# Patient Record
Sex: Male | Born: 1965 | Race: White | Hispanic: No | Marital: Married | Smoking: Former smoker
Health system: Southern US, Community
[De-identification: ages and names within clinical notes are randomized; demographics above are authoritative.]

## PROBLEM LIST (undated history)

## (undated) DIAGNOSIS — M549 Dorsalgia, unspecified: Secondary | ICD-10-CM

## (undated) DIAGNOSIS — F419 Anxiety disorder, unspecified: Secondary | ICD-10-CM

## (undated) DIAGNOSIS — R631 Polydipsia: Secondary | ICD-10-CM

## (undated) DIAGNOSIS — F32A Depression, unspecified: Secondary | ICD-10-CM

## (undated) DIAGNOSIS — G479 Sleep disorder, unspecified: Secondary | ICD-10-CM

## (undated) DIAGNOSIS — K219 Gastro-esophageal reflux disease without esophagitis: Secondary | ICD-10-CM

## (undated) DIAGNOSIS — R42 Dizziness and giddiness: Secondary | ICD-10-CM

## (undated) DIAGNOSIS — I10 Essential (primary) hypertension: Secondary | ICD-10-CM

## (undated) DIAGNOSIS — F329 Major depressive disorder, single episode, unspecified: Secondary | ICD-10-CM

## (undated) DIAGNOSIS — J019 Acute sinusitis, unspecified: Secondary | ICD-10-CM

## (undated) DIAGNOSIS — I2692 Saddle embolus of pulmonary artery without acute cor pulmonale: Secondary | ICD-10-CM

## (undated) DIAGNOSIS — K649 Unspecified hemorrhoids: Secondary | ICD-10-CM

## (undated) DIAGNOSIS — E785 Hyperlipidemia, unspecified: Secondary | ICD-10-CM

## (undated) DIAGNOSIS — D649 Anemia, unspecified: Secondary | ICD-10-CM

## (undated) HISTORY — PX: SHOULDER ARTHROSCOPY: SHX128

## (undated) HISTORY — PX: ANTERIOR CERVICAL DECOMP/DISCECTOMY FUSION: SHX1161

## (undated) HISTORY — DX: Essential (primary) hypertension: I10

## (undated) HISTORY — PX: KNEE ARTHROSCOPY: SHX127

## (undated) HISTORY — PX: ELBOW ARTHROSCOPY: SUR87

## (undated) HISTORY — DX: Hyperlipidemia, unspecified: E78.5

## (undated) HISTORY — PX: VASECTOMY: SHX75

---

## 2001-11-28 ENCOUNTER — Encounter: Admission: RE | Admit: 2001-11-28 | Discharge: 2001-11-29 | Payer: Self-pay | Admitting: *Deleted

## 2004-07-01 ENCOUNTER — Observation Stay (HOSPITAL_COMMUNITY): Admission: RE | Admit: 2004-07-01 | Discharge: 2004-07-02 | Payer: Self-pay | Admitting: Neurosurgery

## 2006-05-15 ENCOUNTER — Ambulatory Visit: Payer: Self-pay | Admitting: Cardiovascular Disease

## 2006-05-15 ENCOUNTER — Observation Stay (HOSPITAL_COMMUNITY): Admission: AD | Admit: 2006-05-15 | Discharge: 2006-05-16 | Payer: Self-pay | Admitting: Cardiovascular Disease

## 2006-09-03 IMAGING — CR DG CHEST 2V
2 series · 2 of 2 positions shown · non-contrast
Comparison: None.

CLINICAL DATA: Disc herniation. Preoperative respiratory evaluation. Smoker with
cough.

CHEST - 2 VIEW  06/29/2004:

[view not recorded (1 of 2)]
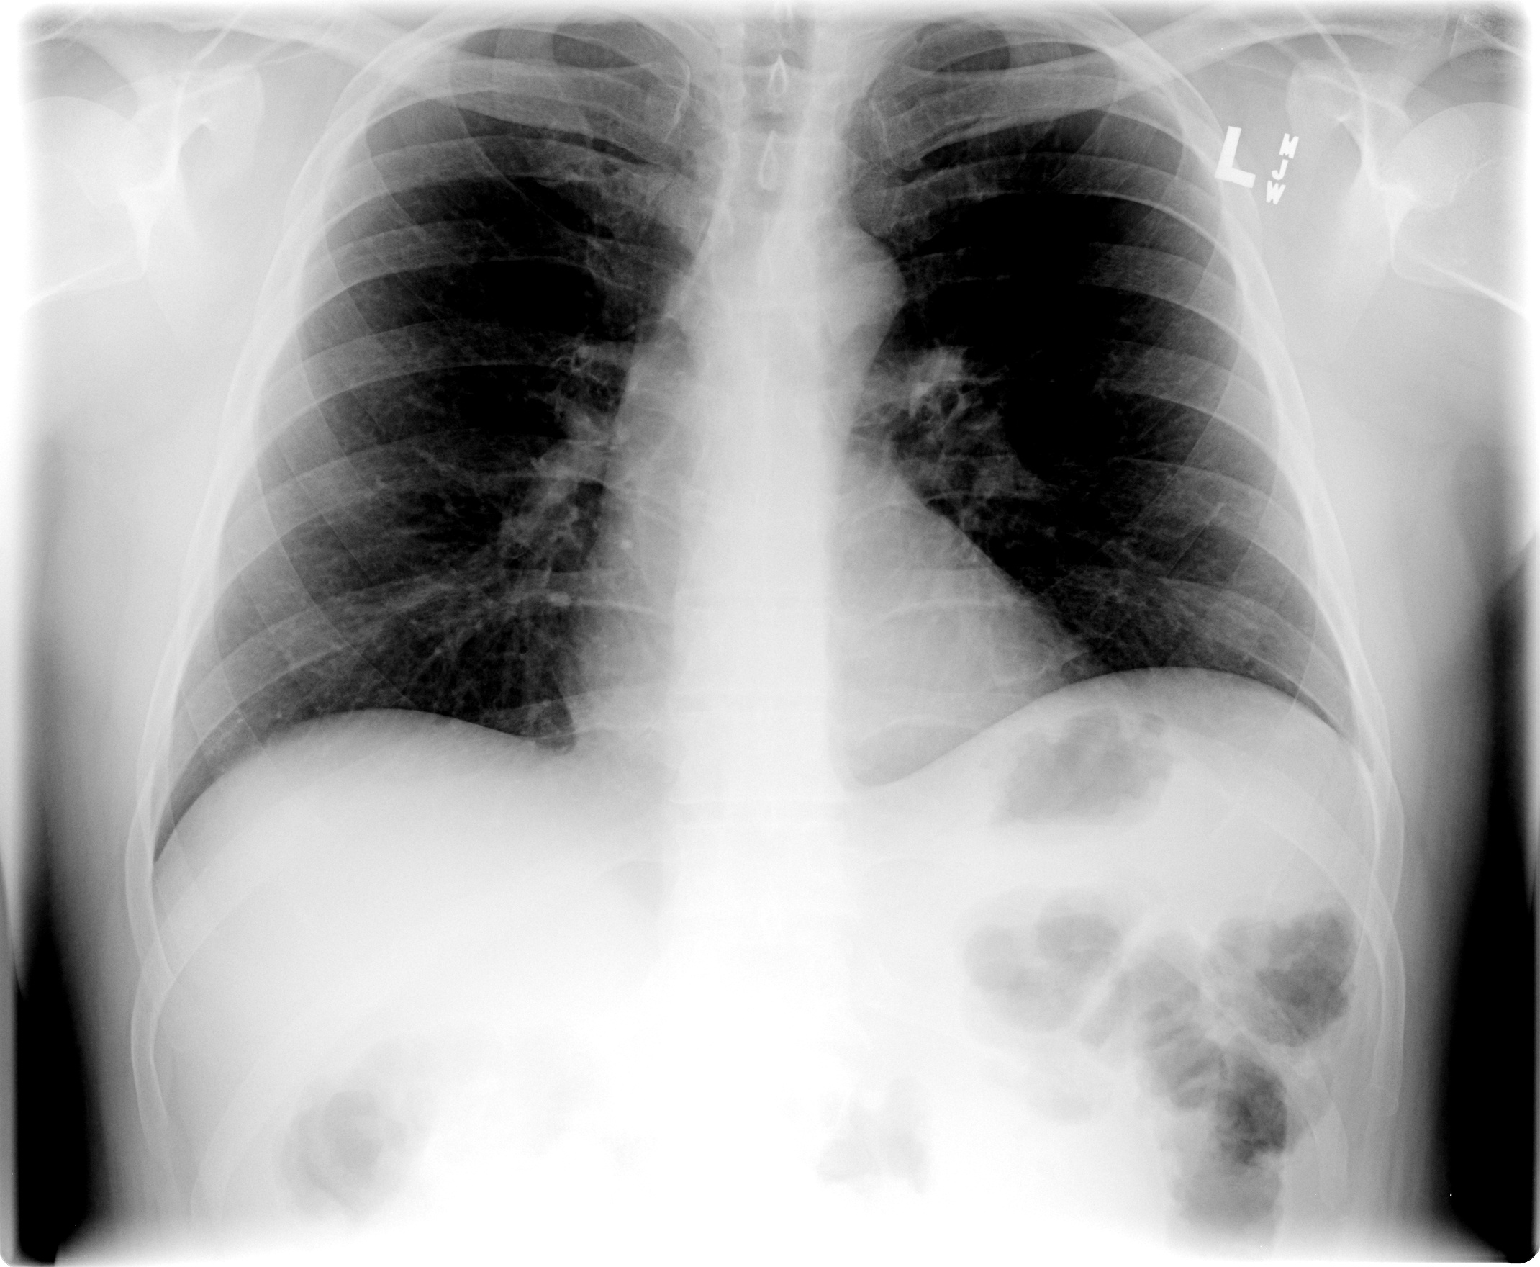

[view not recorded (2 of 2)]
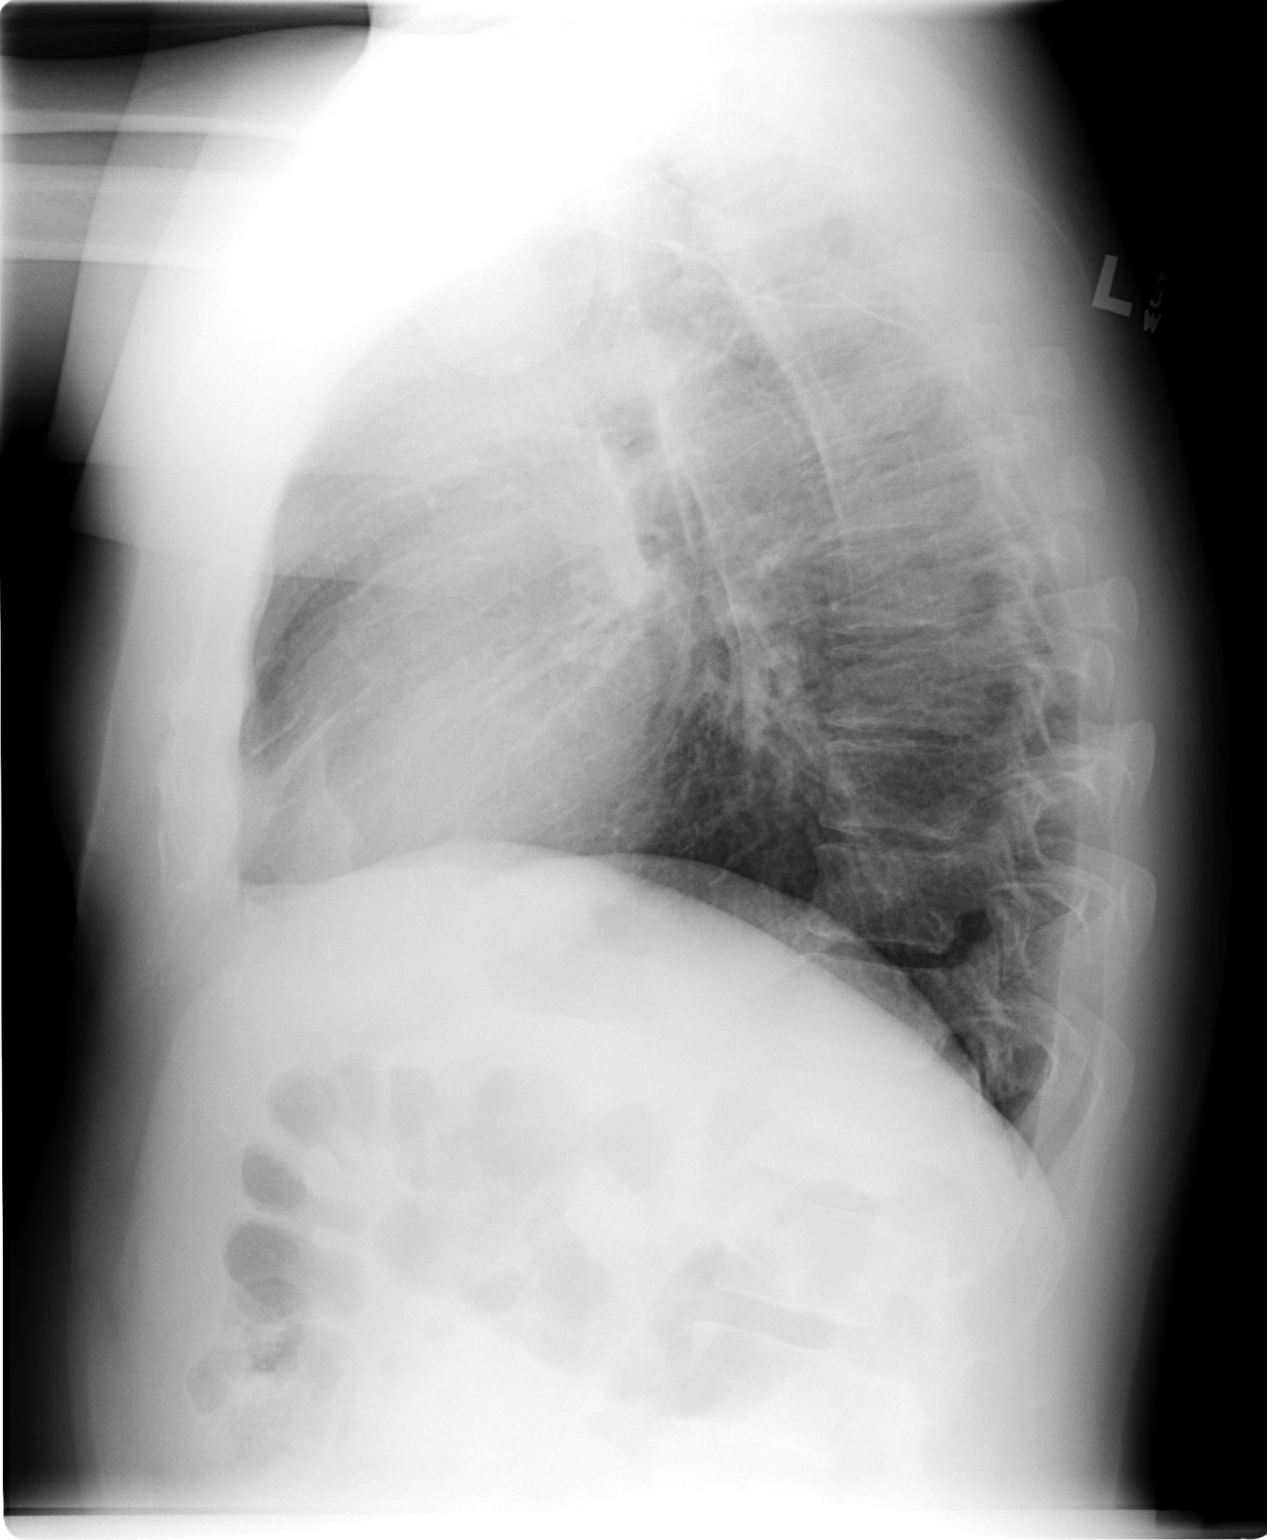

[2 of 2 positions shown; findings below may reference images not displayed]

FINDINGS: The heart size is normal. The thoracic aorta is minimally tortuous.
The hilar and mediastinal contours are otherwise unremarkable. Prominent
bronchovascular markings diffusely with mild central peribronchial thickening is
consistent with chronic bronchitis. The lungs appear clear otherwise. There are
no pleural effusions. Minimal early degenerative changes are present in the
thoracic spine.
IMPRESSION: Probable COPD. No evidence of acute disease.

## 2008-07-19 IMAGING — CR DG CHEST 2V
2 series · 2 of 2 positions shown · non-contrast
Comparison: 06/29/04.

CLINICAL DATA: 40-year-old male with chest pain, unstable angina, precardiac catheterization.  
 CHEST - 2 VIEW:

[view not recorded (1 of 2)]
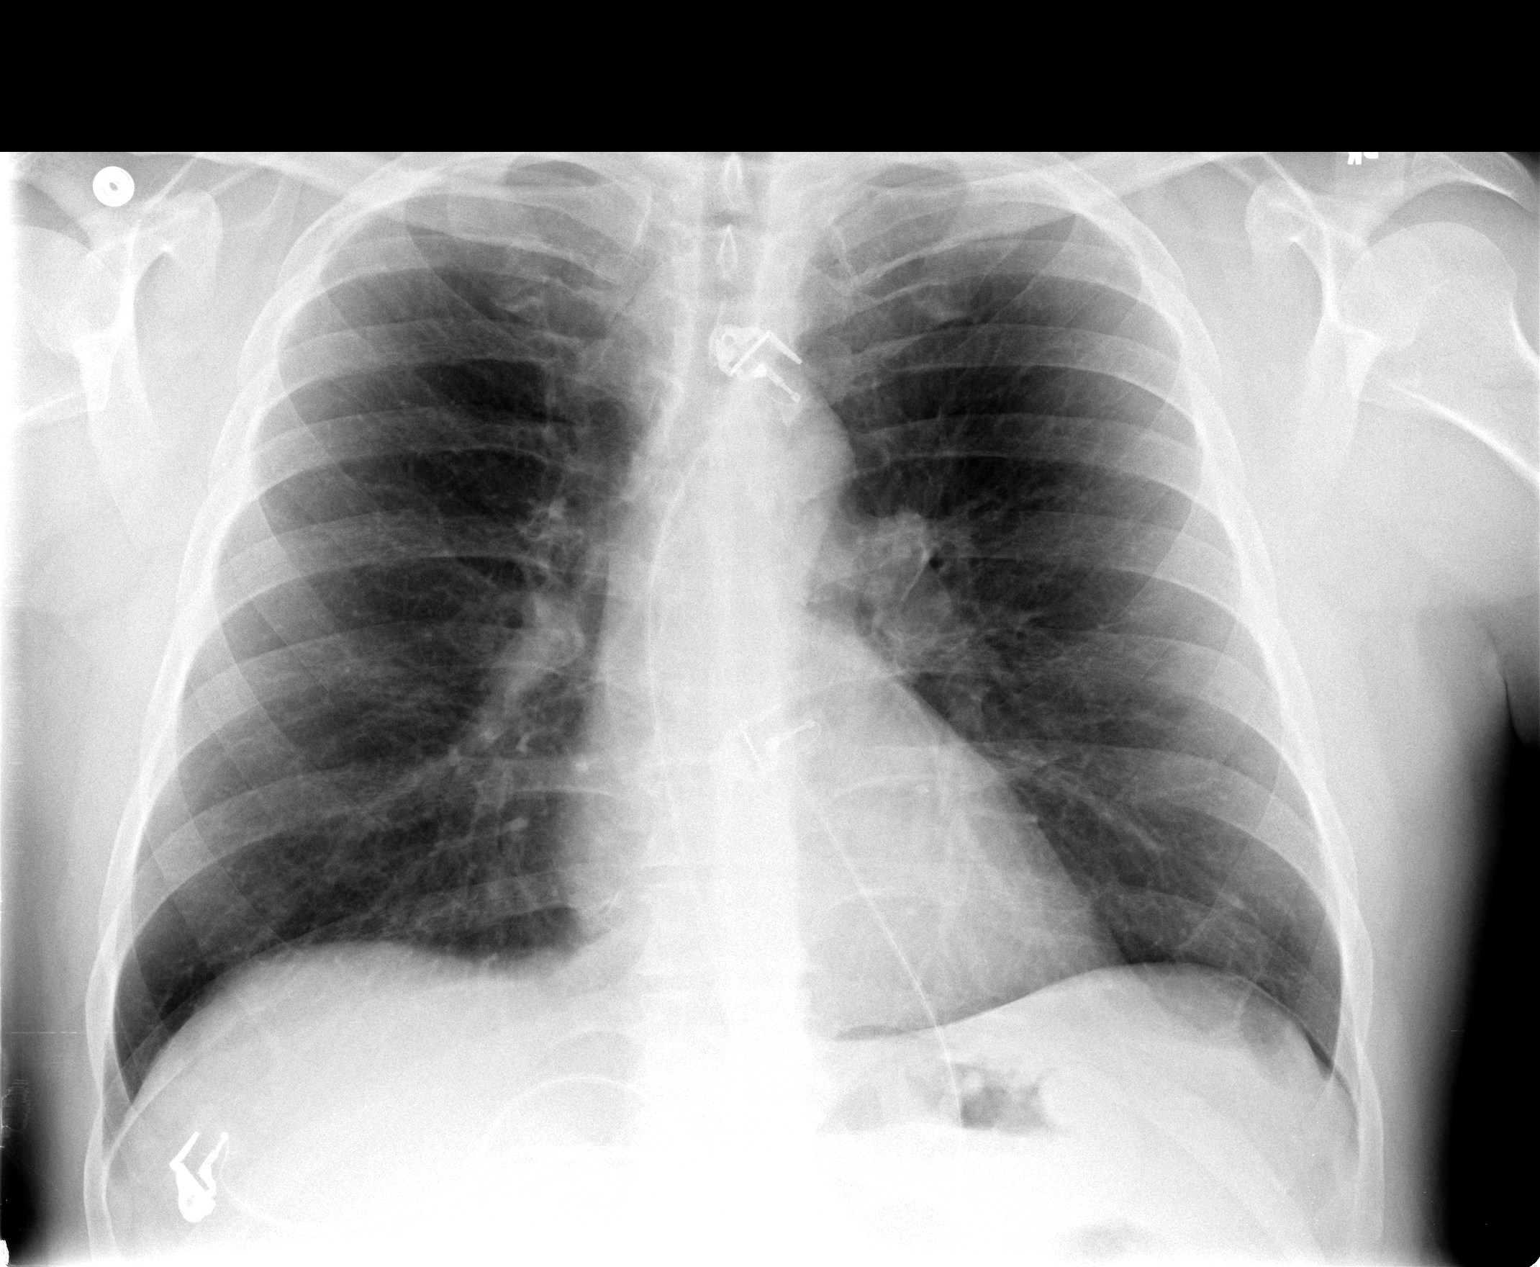

[view not recorded (2 of 2)]
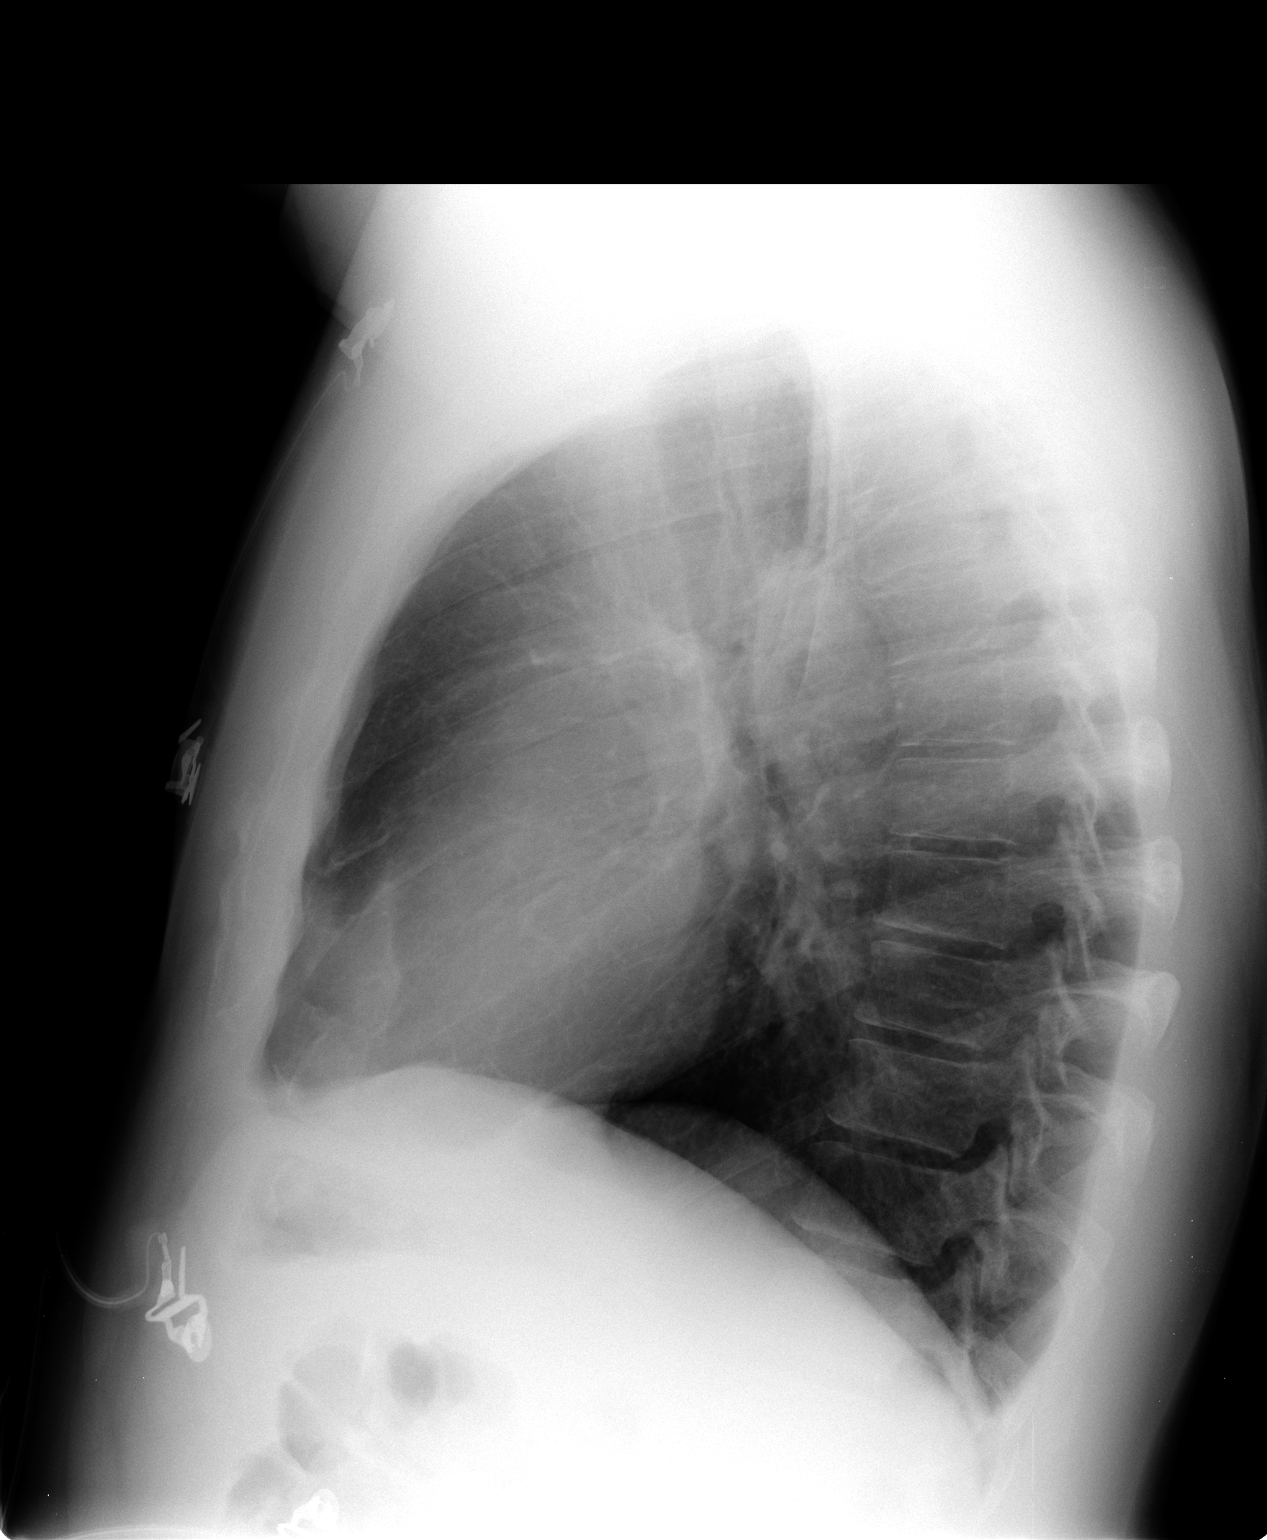

[2 of 2 positions shown; findings below may reference images not displayed]

FINDINGS: The heart size and mediastinal contours are within normal limits.  Both lungs are clear.  The visualized skeletal structures are unremarkable.
IMPRESSION: No active cardiopulmonary disease.

## 2013-02-18 ENCOUNTER — Encounter (INDEPENDENT_AMBULATORY_CARE_PROVIDER_SITE_OTHER): Payer: Self-pay | Admitting: Surgery

## 2013-02-18 ENCOUNTER — Ambulatory Visit (INDEPENDENT_AMBULATORY_CARE_PROVIDER_SITE_OTHER): Payer: No Typology Code available for payment source | Admitting: Surgery

## 2013-02-18 ENCOUNTER — Telehealth (INDEPENDENT_AMBULATORY_CARE_PROVIDER_SITE_OTHER): Payer: Self-pay | Admitting: Surgery

## 2013-02-18 VITALS — BP 130/82 | HR 76 | Resp 14 | Ht 70.0 in | Wt 175.2 lb

## 2013-02-18 DIAGNOSIS — K644 Residual hemorrhoidal skin tags: Secondary | ICD-10-CM | POA: Insufficient documentation

## 2013-02-18 DIAGNOSIS — K648 Other hemorrhoids: Secondary | ICD-10-CM | POA: Insufficient documentation

## 2013-02-18 NOTE — Patient Instructions (Addendum)
See the Handout(s) we gave you.  Consider surgery.  Please call our office at [phone] if you wish to schedule surgery or if you have further questions / concerns.    HEMORRHOIDS  The rectum is the last foot of your colon, and it naturally stretches to hold stool.  Hemorrhoidal piles are natural clusters of blood vessels that help the rectum and anal canal stretch to hold stool and allow bowel movements to eliminate feces.   Hemorrhoids are abnormally swollen blood vessels in the rectum.  Too much pressure in the rectum causes hemorrhoids by forcing blood to stretch and bulge the walls of the veins, sometimes even rupturing them.  Hemorrhoids can become like varicose veins you might see on a person's legs.  Most people will develop a flare of hemorrhoids in their lifetime.  When bulging hemorrhoidal veins are irritated, they can swell, burn, itch, cause pain, and bleed.  Most flares will calm down gradually own within a few weeks.  However, once hemorrhoids are created, they are difficult to get rid of completely and tend to flare more easily than the first flare.   Fortunately, good habits and simple medical treatment usually control hemorrhoids well, and surgery is needed only in severe cases. Types of Hemorrhoids:  Internal hemorrhoids usually don't initially hurt or itch; they are deep inside the rectum and usually have no sensation. If they begin to push out (prolapse), pain and burning can occur.  However, internal hemorrhoids can bleed.  Anal bleeding should not be ignored since bleeding could come from a dangerous source like colorectal cancer, so persistent rectal bleeding should be investigated by a doctor, sometimes with a colonoscopy.  External hemorrhoids cause most of the symptoms - pain, burning, and itching. Nonirritated hemorrhoids can look like small skin tags coming out of the anus.   Thrombosed hemorrhoids can form when a hemorrhoid blood vessel bursts and causes the hemorrhoid  to suddenly swell.  A purple blood clot can form in it and become an excruciatingly painful lump at the anus. Because of these unpleasant symptoms, immediate incision and drainage by a surgeon at an office visit can provide much relief of the pain.    PREVENTION Avoiding the most frequent causes listed below will prevent most cases of hemorrhoids: Constipation Hard stools Diarrhea  Constant sitting  Straining with bowel movements Sitting on the toilet for a long time  Severe coughing  episodes Pregnancy / Childbirth  Heavy Lifting  Sometimes avoiding the above triggers is difficult:  How can you avoid sitting all day if you have a seated job? Also, we try to avoid coughing and diarrhea, but sometimes it's beyond your control.  Still, there are some practical hints to help: Keep the anal and genital area clean.  Moistened tissues such as flushable wet wipes are less irritating than toilet paper.  Using irrigating showers or bottle irrigation washing gently cleans this sensitive area.   Avoid dry toilet paper when cleaning after bowel movements.  Marland Kitchen Keep the anal and genital area dry.  Lightly pat the rectal area dry.  Avoid rubbing.  Talcum or baby powders can help GET YOUR STOOLS SOFT.   This is the most important way to prevent irritated hemorrhoids.  Hard stools are like sandpaper to the anorectal canal and will cause more problems.  The goal: ONE SOFT BOWEL MOVEMENT A DAY!  BMs from every other day to 3 times a day is a tolerable range Treat coughing, diarrhea and constipation early since  irritated hemorrhoids may soon follow.  If your main job activity is seated, always stand or walk during your breaks. Make it a point to stand and walk at least 5 minutes every hour and try to shift frequently in your chair to avoid direct rectal pressure.  Always exhale as you strain or lift. Don't hold your breath.  Do not delay or try to prevent a bowel movement when the urge is present. Exercise regularly  (walking or jogging 60 minutes a day) to stimulate the bowels to move. No reading or other activity while on the toilet. If bowel movements take longer than 5 minutes, you are too constipated. AVOID CONSTIPATION Drink plenty of liquids (1 1/2 to 2 quarts of water and other fluids a day unless fluid restricted for another medical condition). Liquids that contain caffeine (coffee a, tea, soft drinks) can be dehydrating and should be avoided until constipation is controlled. Consider minimizing milk, as dairy products may be constipating. Eat plenty of fiber (30g a day ideal, more if needed).  Fiber is the undigested part of plant food that passes into the colon, acting as "natures broom" to encourage bowel motility and movement.  Fiber can absorb and hold large amounts of water. This results in a larger, bulkier stool, which is soft and easier to pass.  Eating foods high in fiber - 12 servings - such as  Vegetables: Root (potatoes, carrots, turnips), Leafy green (lettuce, salad greens, celery, spinach), High residue (cabbage, broccoli, etc.) Fruit: Fresh, Dried (prunes, apricots, cherries), Stewed (applesauce)  Whole grain breads, pasta, whole wheat Bran cereals, muffins, etc. Consider adding supplemental bulking fiber which retains large volumes of water: Psyllium ground seeds --available as Metamucil, Konsyl, Effersyllium, Per Diem Fiber, or the less expensive generic forms.  Citrucel  (methylcellulose wood fiber) . FiberCon (Polycarbophil) Polyethylene Glycol - and "artificial" fiber commonly called Miralax or Glycolax.  It is helpful for people with gassy or bloated feelings with regular fiber Flax Seed - a less gassy natural fiber  Laxatives can be useful for a short period if constipation is severe Osmotics (Milk of Magnesia, Fleets Phospho-Soda, Magnesium Citrate)  Stimulants (Senokot,   Castor Oil,  Dulcolax, Ex-Lax)    Laxatives are not a good long-term solution as it can stress the bowels  and cause too much mineral loss and dehydration.   Avoid taking laxatives for more than 7 days in a row.  AVOID DIARRHEA Switch to liquids and simpler foods for a few days to avoid stressing your intestines further. Avoid dairy products (especially milk & ice cream) for a short time.  The intestines often can lose the ability to digest lactose when stressed. Avoid foods that cause gassiness or bloating.  Typical foods include beans and other legumes, cabbage, broccoli, and dairy foods.  Every person has some sensitivity to other foods, so listen to your body and avoid those foods that trigger problems for you. Adding fiber (Citrucel, Metamucil, FiberCon, Flax seed, Miralax) gradually can help thicken stools by absorbing excess fluid and retrain the intestines to act more normally.  Slowly increase the dose over a few weeks.  Too much fiber too soon can backfire and cause cramping & bloating. Probiotics (such as active yogurt, Align, etc) may help repopulate the intestines and colon with normal bacteria and calm down a sensitive digestive tract.  Most studies show it to be of mild help, though, and such products can be costly. Medicines: Bismuth subsalicylate (ex. Kayopectate, Pepto Bismol) every 30 minutes for up  to 6 doses can help control diarrhea.  Avoid if pregnant. Loperamide (Immodium) can slow down diarrhea.  Start with two tablets (4mg  total) first and then try one tablet every 6 hours.  Avoid if you are having fevers or severe pain.  If you are not better or start feeling worse, stop all medicines and call your doctor for advice Call your doctor if you are getting worse or not better.  Sometimes further testing (cultures, endoscopy, X-ray studies, bloodwork, etc) may be needed to help diagnose and treat the cause of the diarrhea. TREATMENT OF HEMORRHOID FLARE If these preventive measures fail, you must take action right away! Hemorrhoids are one condition that can be mild in the morning and  become intolerable by nightfall. Most hemorrhoidal flares take several weeks to calm down.  These suggestions can help: Warm soaks.  This helps more than any topical medication.  Use up to 8 times a day.  Usually sitz baths or sitting in a warm bathtub helps.  Sitting on moist warm towels are helpful.  Switching to ice packs/cool compresses can be helpful Normalize your bowels.  Extremes of diarrhea or constipation will make hemorrhoids worse.  One soft bowel movement a day is the goal.  Fiber can help get your bowels regular Wet wipes instead of toilet paper Pain control with a NSAID such as ibuprofen (Advil) or naproxen (Aleve) or acetaminophen (Tylenol) around the clock.  Narcotics are constipating and should be minimized if possible Topical creams contain steroids (bydrocortisone) or local anesthetic (xylocaine) can help make pain and itching more tolerable.   EVALUATION If hemorrhoids are still causing problems, you could benefit by an evaluation by a surgeon.  The surgeon will obtain a history and examine you.  If hemorrhoids are diagnosed, some therapies can be offered in the office, usually with an anoscope into the less sensitive area of the rectum: -injection of hemorrhoids (sclerotherapy) can scar the blood vessels of the swollen/enlarged hemorrhoids to help shrink them down to a more normal size -rubber banding of the enlarged hemorrhoids to help shrink them down to a more normal size -drainage of the blood clot causing a thrombosed hemorrhoid,  to relieve the severe pain   While 90% of the time such problems from hemorrhoids can be managed without preceding to surgery, sometimes the hemorrhoids require a operation to control the problem (uncontrolled bleeding, prolapse, pain, etc.).   This involves being placed under general anesthesia where the surgeon can confirm the diagnosis and remove, suture, or staple the hemorrhoid(s).  Your surgeon can help you treat the problem appropriately.

## 2013-02-18 NOTE — Progress Notes (Signed)
Subjective:     Patient ID: Guy Mayer, male   DOB: 1966-06-04, 47 y.o.   MRN: [id number]  HPI  Guy Mayer  1965-08-13 [id number]  Patient Care Team: Lavell Islam, MD as PCP - General (Internal Medicine) Theda Belfast, MD as Consulting Physician (Gastroenterology)  This patient is a 47 y.o.male who presents today for surgical evaluation at the request of self.   Reason for visit: Worsening/persistent hemorrhoids with prolapse bleeding and pain  Pleasant male that is struggled with hemorrhoids for many years.  Has tried bandings in the past.  Was felt to be a good candidate for PPH stapling in 2010.  Due to personal and work issues, he has delayed surgery.  However, now things are worsening and he wishes to reconsider.  He gets pain with bowel movements.  The hemorrhoids with prolapse out.  They are very painful when that happens..  Difficult to push back in.  Blood as frequent as well.  He usually has one bowel movement a morning.  He does not take a fiber supplement.  No severe dyskinesia.  He has had colonoscopy by Dr. Elnoria Howard in the past.  He has not had any treatments done since seen in 2010.  No personal nor family history of GI/colon cancer, inflammatory bowel disease, irritable bowel syndrome, allergy such as Celiac Sprue, dietary/dairy problems, colitis, ulcers nor gastritis.  No recent sick contacts/gastroenteritis.  No travel outside the country.  No changes in diet.    Patient Active Problem List   Diagnosis Date Noted  . Internal hemorrhoids with prolapse & bleeding 02/18/2013  . External hemorrhoids with pain 02/18/2013    Past Medical History  Diagnosis Date  . Hypertension   . Hyperlipidemia     Past Surgical History  Procedure Laterality Date  . Vasectomy    . Anterior cervical decomp/discectomy fusion    . Shoulder arthroscopy    . Elbow arthroscopy    . Knee arthroscopy      History   Social History  . Marital Status: Married    Spouse Name:  N/A    Number of Children: N/A  . Years of Education: N/A   Occupational History  . Not on file.   Social History Main Topics  . Smoking status: Current Every Day Smoker -- 0.25 packs/day  . Smokeless tobacco: Never Used  . Alcohol Use: 16.8 oz/week    28 Shots of liquor per week  . Drug Use: No  . Sexual Activity: Not on file   Other Topics Concern  . Not on file   Social History Narrative  . No narrative on file    History reviewed. No pertinent family history.  Current Outpatient Prescriptions  Medication Sig Dispense Refill  . dexlansoprazole (DEXILANT) 60 MG capsule Take 60 mg by mouth daily.       No current facility-administered medications for this visit.     No Known Allergies  BP 130/82  Pulse 76  Resp 14  Ht 5\' 10"  (1.778 m)  Wt 175 lb 3.2 oz (79.47 kg)  BMI 25.14 kg/m2  No results found.   Review of Systems  Constitutional: Negative for fever, chills and diaphoresis.  HENT: Negative for nosebleeds, sore throat, facial swelling, mouth sores, trouble swallowing and ear discharge.   Eyes: Negative for photophobia, discharge and visual disturbance.  Respiratory: Negative for choking, chest tightness, shortness of breath and stridor.   Cardiovascular: Negative for chest pain and palpitations.  Gastrointestinal: Positive for anal  bleeding and rectal pain. Negative for nausea, vomiting, abdominal pain, diarrhea, constipation, blood in stool and abdominal distention.  Endocrine: Negative for cold intolerance and heat intolerance.  Genitourinary: Negative for dysuria, urgency, difficulty urinating and testicular pain.  Musculoskeletal: Negative for myalgias, back pain, arthralgias and gait problem.  Skin: Negative for color change, pallor, rash and wound.  Allergic/Immunologic: Negative for environmental allergies and food allergies.  Neurological: Negative for dizziness, speech difficulty, weakness, numbness and headaches.  Hematological: Negative for  adenopathy. Does not bruise/bleed easily.  Psychiatric/Behavioral: Negative for hallucinations, confusion and agitation.       Objective:   Physical Exam  Constitutional: He is oriented to person, place, and time. He appears well-developed and well-nourished. No distress.  HENT:  Head: Normocephalic.  Mouth/Throat: Oropharynx is clear and moist. No oropharyngeal exudate.  Eyes: Conjunctivae and EOM are normal. Pupils are equal, round, and reactive to light. No scleral icterus.  Neck: Normal range of motion. Neck supple. No tracheal deviation present.  Cardiovascular: Normal rate, regular rhythm and intact distal pulses.   Pulmonary/Chest: Effort normal and breath sounds normal. No respiratory distress.  Abdominal: Soft. He exhibits no distension. There is no tenderness. Hernia confirmed negative in the right inguinal area and confirmed negative in the left inguinal area.  Genitourinary:  Exam done with assistance of male Medical Assistant in the room.  Perianal skin clean with good hygiene.  No pruritis.  No pilonidal disease.  No fissure.  No abscess/fistula.    Moderate R > L sided external skin tags / hemorrhoids.  Tolerates digital and anoscopic rectal exam but sensitive.  Mildly increased sphincter tone.  No rectal masses.  Hemorrhoidal piles very enlarged R posterior = L lateral > R anterior.  Partially prolapsed  Musculoskeletal: Normal range of motion. He exhibits no tenderness.  Lymphadenopathy:    He has no cervical adenopathy.       Right: No inguinal adenopathy present.       Left: No inguinal adenopathy present.  Neurological: He is alert and oriented to person, place, and time. No cranial nerve deficit. He exhibits normal muscle tone. Coordination normal.  Skin: Skin is warm and dry. No rash noted. He is not diaphoretic. No erythema. No pallor.  Psychiatric: He has a normal mood and affect. His behavior is normal. Judgment and thought content normal.       Assessment:      Partially prolapsed hemorrhoids with worsening prolapse bleeding and pain.  It worsening despite rather regular bowel function.     Plan:     I think this is too foregone to expect bandings to work.  They did on the past.  I recommended surgery.  He would be a good candidate for a THD hemorrhoidal ligation/pexy.  Probably will need a few of the skin tags removed as well depending how things look.  He wishes to check with his work schedule and plan this in the fall.  I discussed with him:  The anatomy & physiology of the anorectal region was discussed.  The pathophysiology of hemorrhoids and differential diagnosis was discussed.  Natural history risks without surgery was discussed.   I stressed the importance of a bowel regimen to have daily soft bowel movements to minimize progression of disease.  Interventions such as sclerotherapy & banding were discussed.  The patient's symptoms are not adequately controlled by medicines and other non-operative treatments.  I feel the risks & problems of no surgery outweigh the operative risks; therefore, I recommended surgery  to treat the hemorrhoids by ligation, pexy, and possible resection.  Risks such as bleeding, infection, need for further treatment, heart attack, death, and other risks were discussed.   I noted a good likelihood this will help address the problem.  Goals of post-operative recovery were discussed as well.  Possibility that this will not correct all symptoms was explained.  Post-operative pain, bleeding, constipation, and other problems after surgery were discussed.  We will work to minimize complications.   Educational handouts further explaining the pathology, treatment options, and bowel regimen were given as well.  Questions were answered.  The patient expresses understanding & wishes to proceed with surgery.

## 2013-02-18 NOTE — Telephone Encounter (Signed)
Patient wants to think about surgery and will call back to schedule.

## 2013-02-20 ENCOUNTER — Telehealth (INDEPENDENT_AMBULATORY_CARE_PROVIDER_SITE_OTHER): Payer: Self-pay | Admitting: Surgery

## 2013-02-20 NOTE — Telephone Encounter (Signed)
Left message for pt 8/18 8/19 8/20 no return call. Assume pt will call when ready to schedule surgery.

## 2013-02-25 ENCOUNTER — Ambulatory Visit (INDEPENDENT_AMBULATORY_CARE_PROVIDER_SITE_OTHER): Payer: Self-pay | Admitting: Surgery

## 2013-03-12 ENCOUNTER — Telehealth (INDEPENDENT_AMBULATORY_CARE_PROVIDER_SITE_OTHER): Payer: Self-pay | Admitting: Surgery

## 2013-03-28 ENCOUNTER — Encounter (HOSPITAL_COMMUNITY): Payer: Self-pay | Admitting: Pharmacy Technician

## 2013-04-02 ENCOUNTER — Encounter (HOSPITAL_COMMUNITY): Payer: Self-pay

## 2013-04-02 ENCOUNTER — Encounter (HOSPITAL_COMMUNITY)
Admission: RE | Admit: 2013-04-02 | Discharge: 2013-04-02 | Disposition: A | Discharge status: Home / Self Care | Payer: No Typology Code available for payment source | Source: Ambulatory Visit | Attending: Surgery | Admitting: Surgery

## 2013-04-02 DIAGNOSIS — Z01812 Encounter for preprocedural laboratory examination: Secondary | ICD-10-CM | POA: Insufficient documentation

## 2013-04-02 DIAGNOSIS — Z01810 Encounter for preprocedural cardiovascular examination: Secondary | ICD-10-CM | POA: Insufficient documentation

## 2013-04-02 DIAGNOSIS — Z01818 Encounter for other preprocedural examination: Secondary | ICD-10-CM | POA: Insufficient documentation

## 2013-04-02 HISTORY — DX: Dizziness and giddiness: R42

## 2013-04-02 HISTORY — DX: Unspecified hemorrhoids: K64.9

## 2013-04-02 HISTORY — DX: Sleep disorder, unspecified: G47.9

## 2013-04-02 HISTORY — DX: Gastro-esophageal reflux disease without esophagitis: K21.9

## 2013-04-02 HISTORY — DX: Anemia, unspecified: D64.9

## 2013-04-02 HISTORY — DX: Anxiety disorder, unspecified: F41.9

## 2013-04-02 HISTORY — DX: Major depressive disorder, single episode, unspecified: F32.9

## 2013-04-02 HISTORY — DX: Dorsalgia, unspecified: M54.9

## 2013-04-02 HISTORY — DX: Polydipsia: R63.1

## 2013-04-02 HISTORY — DX: Depression, unspecified: F32.A

## 2013-04-02 LAB — CBC
HCT: 36.4 % — ABNORMAL LOW (ref 39.0–52.0)
Hemoglobin: 12.3 g/dL — ABNORMAL LOW (ref 13.0–17.0)
MCH: 29.6 pg (ref 26.0–34.0)
MCHC: 33.8 g/dL (ref 30.0–36.0)
MCV: 87.7 fL (ref 78.0–100.0)
Platelets: 338 10*3/uL (ref 150–400)
RBC: 4.15 MIL/uL — ABNORMAL LOW (ref 4.22–5.81)
RDW: 13.9 % (ref 11.5–15.5)
WBC: 7.9 10*3/uL (ref 4.0–10.5)

## 2013-04-02 LAB — BASIC METABOLIC PANEL
BUN: 16 mg/dL (ref 6–23)
CO2: 30 mEq/L (ref 19–32)
Calcium: 9.2 mg/dL (ref 8.4–10.5)
Chloride: 107 mEq/L (ref 96–112)
Creatinine, Ser: 1.3 mg/dL (ref 0.50–1.35)
GFR calc Af Amer: 73 mL/min — ABNORMAL LOW (ref >90)
GFR calc non Af Amer: 63 mL/min — ABNORMAL LOW (ref >90)
Glucose, Bld: 87 mg/dL (ref 70–99)
Potassium: 4.2 mEq/L (ref 3.5–5.1)
Sodium: 142 mEq/L (ref 135–145)

## 2013-04-02 NOTE — Patient Instructions (Addendum)
LAURIE LOVEJOY  04/02/2013                           YOUR PROCEDURE IS SCHEDULED ON: 04/12/13               PLEASE REPORT TO SHORT STAY CENTER AT :  5:30 AM               CALL THIS NUMBER IF ANY PROBLEMS THE DAY OF SURGERY :               [phone]                      REMEMBER:   Do not eat food or drink liquids AFTER MIDNIGHT   Take these medicines the morning of surgery with A SIP OF WATER:  NONE   Do not wear jewelry, make-up   Do not wear lotions, powders, or perfumes.   Do not shave legs or underarms 12 hrs. before surgery (men may shave face)  Do not bring valuables to the hospital.  Contacts, dentures or bridgework may not be worn into surgery.  Leave suitcase in the car. After surgery it may be brought to your room.  For patients admitted to the hospital more than one night, checkout time is 11:00                          The day of discharge.   Patients discharged the day of surgery will not be allowed to drive home                             If going home same day of surgery, must have someone stay with you first                           24 hrs at home and arrange for some one to drive you home from hospital.    Special Instructions:   Please read over the following fact sheets that you were given              1. [place] PREPARING FOR SURGERY SHEET              2. DISCONTINUE ASPIRIN AND HERBAL MEDICATION 5 DAYS PREOP                                                X_____________________________________________________________________        Failure to follow these instructions may result in cancellation of your surgery

## 2013-04-11 NOTE — Anesthesia Preprocedure Evaluation (Addendum)
Anesthesia Evaluation  Patient identified by MRN, date of birth, ID band Patient awake    Reviewed: Allergy & Precautions, H&P , NPO status , Patient's Chart, lab work & pertinent test results  Airway Mallampati: II TM Distance: <3 FB Neck ROM: Full    Dental no notable dental hx.    Pulmonary Current Smoker,  breath sounds clear to auscultation  Pulmonary exam normal       Cardiovascular hypertension, Rhythm:Regular Rate:Normal     Neuro/Psych negative neurological ROS  negative psych ROS   GI/Hepatic Neg liver ROS, GERD-  Medicated,  Endo/Other  negative endocrine ROS  Renal/GU negative Renal ROS  negative genitourinary   Musculoskeletal negative musculoskeletal ROS (+)   Abdominal   Peds negative pediatric ROS (+)  Hematology negative hematology ROS (+)   Anesthesia Other Findings   Reproductive/Obstetrics negative OB ROS                          Anesthesia Physical Anesthesia Plan  ASA: II  Anesthesia Plan: General   Post-op Pain Management:    Induction: Intravenous  Airway Management Planned: Oral ETT and LMA  Additional Equipment:   Intra-op Plan:   Post-operative Plan: Extubation in OR  Informed Consent: I have reviewed the patients History and Physical, chart, labs and discussed the procedure including the risks, benefits and alternatives for the proposed anesthesia with the patient or authorized representative who has indicated his/her understanding and acceptance.   Dental advisory given  Plan Discussed with: CRNA and Surgeon  Anesthesia Plan Comments:         Anesthesia Quick Evaluation

## 2013-04-12 ENCOUNTER — Ambulatory Visit (HOSPITAL_COMMUNITY): Payer: No Typology Code available for payment source | Admitting: Anesthesiology

## 2013-04-12 ENCOUNTER — Encounter (HOSPITAL_COMMUNITY): Payer: Self-pay

## 2013-04-12 ENCOUNTER — Ambulatory Visit (HOSPITAL_COMMUNITY)
Admission: RE | Admit: 2013-04-12 | Discharge: 2013-04-12 | Disposition: A | Discharge status: Home / Self Care | Payer: No Typology Code available for payment source | Source: Ambulatory Visit | Attending: Surgery | Admitting: Surgery

## 2013-04-12 ENCOUNTER — Encounter (HOSPITAL_COMMUNITY): Payer: No Typology Code available for payment source | Admitting: Anesthesiology

## 2013-04-12 ENCOUNTER — Encounter (HOSPITAL_COMMUNITY)
Admission: RE | Disposition: A | Discharge status: Home / Self Care | Payer: Self-pay | Source: Ambulatory Visit | Attending: Surgery

## 2013-04-12 DIAGNOSIS — K648 Other hemorrhoids: Secondary | ICD-10-CM

## 2013-04-12 DIAGNOSIS — J329 Chronic sinusitis, unspecified: Secondary | ICD-10-CM | POA: Insufficient documentation

## 2013-04-12 DIAGNOSIS — K644 Residual hemorrhoidal skin tags: Secondary | ICD-10-CM

## 2013-04-12 DIAGNOSIS — E785 Hyperlipidemia, unspecified: Secondary | ICD-10-CM | POA: Insufficient documentation

## 2013-04-12 DIAGNOSIS — K219 Gastro-esophageal reflux disease without esophagitis: Secondary | ICD-10-CM | POA: Insufficient documentation

## 2013-04-12 DIAGNOSIS — I10 Essential (primary) hypertension: Secondary | ICD-10-CM | POA: Insufficient documentation

## 2013-04-12 DIAGNOSIS — R110 Nausea: Secondary | ICD-10-CM | POA: Insufficient documentation

## 2013-04-12 HISTORY — PX: TRANSANAL HEMORRHOIDAL DEARTERIALIZATION: SHX6136

## 2013-04-12 HISTORY — DX: Acute sinusitis, unspecified: J01.90

## 2013-04-12 SURGERY — TRANSANAL HEMORRHOIDAL DEARTERIALIZATION
Anesthesia: General | Site: Anus | Wound class: Contaminated

## 2013-04-12 MED ORDER — CHLORHEXIDINE GLUCONATE 4 % EX LIQD
1.0000 "application " | Freq: Once | CUTANEOUS | Status: DC
  Filled 2013-04-12: qty 15

## 2013-04-12 MED ORDER — FENTANYL CITRATE 0.05 MG/ML IJ SOLN
INTRAMUSCULAR | Status: DC | PRN
  Administered 2013-04-12: 150 ug via INTRAVENOUS
  Administered 2013-04-12: 100 ug via INTRAVENOUS

## 2013-04-12 MED ORDER — OXYCODONE HCL 5 MG PO TABS: 5.0000 mg | ORAL_TABLET | ORAL | Status: DC | PRN

## 2013-04-12 MED ORDER — LIDOCAINE HCL (PF) 2 % IJ SOLN
INTRAMUSCULAR | Status: DC | PRN
  Administered 2013-04-12: 75 mg

## 2013-04-12 MED ORDER — LACTATED RINGERS IV SOLN: INTRAVENOUS | Status: DC

## 2013-04-12 MED ORDER — OXYCODONE HCL 5 MG PO TABS
5.0000 mg | ORAL_TABLET | ORAL | Status: DC | PRN
  Administered 2013-04-12: 5 mg via ORAL
  Filled 2013-04-12: qty 1

## 2013-04-12 MED ORDER — SODIUM CHLORIDE 0.9 % IJ SOLN
INTRAMUSCULAR | Status: DC | PRN
  Administered 2013-04-12: 10 mL

## 2013-04-12 MED ORDER — MIDAZOLAM HCL 5 MG/5ML IJ SOLN
INTRAMUSCULAR | Status: DC | PRN
  Administered 2013-04-12: 2 mg via INTRAVENOUS

## 2013-04-12 MED ORDER — LIDOCAINE-HYDROCORTISONE ACE 3-2.5 % RE KIT: 1.0000 "application " | PACK | Freq: Four times a day (QID) | RECTAL | Status: DC | PRN

## 2013-04-12 MED ORDER — NEOSTIGMINE METHYLSULFATE 1 MG/ML IJ SOLN
INTRAMUSCULAR | Status: DC | PRN
  Administered 2013-04-12: 3.5 mg via INTRAVENOUS
  Administered 2013-04-12: 1 mg via INTRAVENOUS

## 2013-04-12 MED ORDER — DEXTROSE 5 % IV SOLN
2.0000 g | INTRAVENOUS | Status: AC
  Administered 2013-04-12: 2 g via INTRAVENOUS

## 2013-04-12 MED ORDER — FENTANYL CITRATE 0.05 MG/ML IJ SOLN: 25.0000 ug | INTRAMUSCULAR | Status: DC | PRN

## 2013-04-12 MED ORDER — PROMETHAZINE HCL 25 MG/ML IJ SOLN: 6.2500 mg | INTRAMUSCULAR | Status: DC | PRN

## 2013-04-12 MED ORDER — BUPIVACAINE LIPOSOME 1.3 % IJ SUSP
INTRAMUSCULAR | Status: DC | PRN
  Administered 2013-04-12: 20 mL

## 2013-04-12 MED ORDER — PROPOFOL 10 MG/ML IV BOLUS
INTRAVENOUS | Status: DC | PRN
  Administered 2013-04-12: 200 mg via INTRAVENOUS

## 2013-04-12 MED ORDER — HYDRALAZINE HCL 20 MG/ML IJ SOLN
10.0000 mg | Freq: Once | INTRAMUSCULAR | Status: AC
  Administered 2013-04-12: 10 mg via INTRAVENOUS

## 2013-04-12 MED ORDER — NAPROXEN 500 MG PO TABS: 500.0000 mg | ORAL_TABLET | Freq: Two times a day (BID) | ORAL | Status: DC

## 2013-04-12 MED ORDER — KETOROLAC TROMETHAMINE 30 MG/ML IJ SOLN
INTRAMUSCULAR | Status: DC | PRN
  Administered 2013-04-12: 30 mg via INTRAVENOUS

## 2013-04-12 MED ORDER — KETOROLAC TROMETHAMINE 30 MG/ML IJ SOLN: 15.0000 mg | Freq: Once | INTRAMUSCULAR | Status: DC | PRN

## 2013-04-12 MED ORDER — LACTATED RINGERS IV SOLN
INTRAVENOUS | Status: DC | PRN
  Administered 2013-04-12: 07:00:00 via INTRAVENOUS

## 2013-04-12 MED ORDER — PROMETHAZINE HCL 25 MG/ML IJ SOLN
6.2500 mg | INTRAMUSCULAR | Status: DC | PRN
  Administered 2013-04-12: 6.25 mg via INTRAVENOUS
  Filled 2013-04-12: qty 1

## 2013-04-12 MED ORDER — GLYCOPYRROLATE 0.2 MG/ML IJ SOLN
INTRAMUSCULAR | Status: DC | PRN
  Administered 2013-04-12: 0.4 mg via INTRAVENOUS
  Administered 2013-04-12: 0.2 mg via INTRAVENOUS

## 2013-04-12 MED ORDER — HYDROMORPHONE HCL PF 1 MG/ML IJ SOLN
INTRAMUSCULAR | Status: DC | PRN
  Administered 2013-04-12 (×2): 1 mg via INTRAVENOUS

## 2013-04-12 MED ORDER — HYDRALAZINE HCL 20 MG/ML IJ SOLN
INTRAMUSCULAR | Status: AC
  Filled 2013-04-12: qty 1

## 2013-04-12 MED ORDER — BUPIVACAINE LIPOSOME 1.3 % IJ SUSP
20.0000 mL | INTRAMUSCULAR | Status: DC
  Filled 2013-04-12: qty 20

## 2013-04-12 MED ORDER — DEXTROSE 5 % IV SOLN
INTRAVENOUS | Status: AC
  Filled 2013-04-12 (×2): qty 1

## 2013-04-12 MED ORDER — ROCURONIUM BROMIDE 100 MG/10ML IV SOLN
INTRAVENOUS | Status: DC | PRN
  Administered 2013-04-12: 10 mg via INTRAVENOUS
  Administered 2013-04-12: 50 mg via INTRAVENOUS
  Administered 2013-04-12: 10 mg via INTRAVENOUS

## 2013-04-12 MED ORDER — ONDANSETRON HCL 4 MG/2ML IJ SOLN
INTRAMUSCULAR | Status: DC | PRN
  Administered 2013-04-12: 4 mg via INTRAMUSCULAR

## 2013-04-12 SURGICAL SUPPLY — 25 items
BLADE EXTENDED COATED 6.5IN (ELECTRODE) IMPLANT
BLADE HEX COATED 2.75 (ELECTRODE) ×2 IMPLANT
BRIEF STRETCH FOR OB PAD LRG (UNDERPADS AND DIAPERS) ×2 IMPLANT
CANISTER SUCTION 2500CC (MISCELLANEOUS) ×2 IMPLANT
DECANTER SPIKE VIAL GLASS SM (MISCELLANEOUS) ×2 IMPLANT
DRAPE LAPAROTOMY T 102X78X121 (DRAPES) ×2 IMPLANT
DRSG PAD ABDOMINAL 8X10 ST (GAUZE/BANDAGES/DRESSINGS) IMPLANT
ELECT REM PT RETURN 9FT ADLT (ELECTROSURGICAL) ×2
ELECTRODE REM PT RTRN 9FT ADLT (ELECTROSURGICAL) ×1 IMPLANT
GLOVE BIOGEL PI IND STRL 8 (GLOVE) ×1 IMPLANT
GLOVE BIOGEL PI INDICATOR 8 (GLOVE) ×1
GLOVE ECLIPSE 8.0 STRL XLNG CF (GLOVE) ×4 IMPLANT
GOWN STRL REIN XL XLG (GOWN DISPOSABLE) ×4 IMPLANT
KIT SLIDE ONE PROLAPS HEMORR (KITS) ×2 IMPLANT
LUBRICANT JELLY K Y 4OZ (MISCELLANEOUS) ×2 IMPLANT
NEEDLE HYPO 22GX1.5 SAFETY (NEEDLE) ×2 IMPLANT
NS IRRIG 1000ML POUR BTL (IV SOLUTION) ×2 IMPLANT
PACK BASIC VI WITH GOWN DISP (CUSTOM PROCEDURE TRAY) ×2 IMPLANT
PENCIL BUTTON HOLSTER BLD 10FT (ELECTRODE) ×2 IMPLANT
SPONGE GAUZE 4X4 12PLY (GAUZE/BANDAGES/DRESSINGS) ×2 IMPLANT
SUT CHROMIC 3 0 SH 27 (SUTURE) ×2 IMPLANT
SUT VIC AB 2-0 UR6 27 (SUTURE) IMPLANT
SYR 20CC LL (SYRINGE) ×2 IMPLANT
TOWEL OR 17X26 10 PK STRL BLUE (TOWEL DISPOSABLE) ×2 IMPLANT
TOWEL OR NON WOVEN STRL DISP B (DISPOSABLE) ×2 IMPLANT

## 2013-04-12 NOTE — Op Note (Signed)
04/12/2013  8:52 AM  PATIENT:  Guy Mayer  47 y.o. male  Patient Care Team: Lavell Islam, MD as PCP - General (Internal Medicine) Theda Belfast, MD as Consulting Physician (Gastroenterology)  PRE-OPERATIVE DIAGNOSIS:  internal and external hemorrhoids with pain bleeding and prolaspe   POST-OPERATIVE DIAGNOSIS:  internal and external hemorrhoids with pain, bleeding, and prolaspe   PROCEDURE:  Procedure(s): TRANSANAL HEMORRHOIDAL DEARTERIALIZATION ligation  External hemorrhoidectomy x1 pile (right posterior)   SURGEON:  Surgeon(s): Ardeth Sportsman, MD  ASSISTANT: Bhavinkumar "Vin" Bhagat, PA student, [employer]  ANESTHESIA:   local and general  EBL:     Delay start of Pharmacological VTE agent (>24hrs) due to surgical blood loss or risk of bleeding:  no  DRAINS: none   SPECIMEN:  Source of Specimen:  External hemorrhoid, right posterior  DISPOSITION OF SPECIMEN:  PATHOLOGY  COUNTS:  YES  PLAN OF CARE: Discharge to home after PACU  PATIENT DISPOSITION:  PACU - hemodynamically stable.  INDICATION: Pleasant active male struggling with hemorrhoids.  To pile prolapse and last pile intermittently prolapsing.  Also external hemorrhoids.  Very sensitive.  I recommended surgery:  The anatomy & physiology of the anorectal region was discussed.  The pathophysiology of hemorrhoids and differential diagnosis was discussed.  Natural history risks without surgery was discussed.   I stressed the importance of a bowel regimen to have daily soft bowel movements to minimize progression of disease.  Interventions such as sclerotherapy & banding were discussed.  The patient's symptoms are not adequately controlled by medicines and other non-operative treatments.  I feel the risks & problems of no surgery outweigh the operative risks; therefore, I recommended surgery to treat the hemorrhoids by ligation, pexy, and possible resection.  Risks such as bleeding, infection,  urinary difficulties, need for further treatment, heart attack, death, and other risks were discussed.   I noted a good likelihood this will help address the problem.  Goals of post-operative recovery were discussed as well.  Possibility that this will not correct all symptoms was explained.  Post-operative pain, bleeding, constipation, and other problems after surgery were discussed.  We will work to minimize complications.   Educational handouts further explaining the pathology, treatment options, and bowel regimen were given as well.  Questions were answered.  The patient expresses understanding & wishes to proceed with surgery.   OR FINDINGS: Very large left lateral and right posterior hemorrhoidal piles with chronic prolapse.  Right anterior imminent intermittently prolapsing.  Persistent large right posterior hemorrhoidal pile after ligation.  Removed  DESCRIPTION:   Informed consent was confirmed. Patient underwent general anesthesia without difficulty. Patient was placed into prone positioning.  The perianal region was prepped and draped in sterile fashion. Surgical time-out confirmed our plan.  I did digital rectal examination and then transitioned over to anoscopy to get a sense of the anatomy.  I switched over to the [facility] fiberoptically lit Doppler anocope.   Using the Doppler on the tip of the THD anoscope, I identified the arterial hemorrhoidal vessels coming in in the classic hexagonal anatomical pattern (right posterior/lateral/anterior, left posterior /lateral/anterior).    I proceeded to ligate the hemorrhoidal arteries. I used a 2-0 Vicryl suture on a UR-6 needle in a figure-of-eight fashion over the signal around 6 cm proximal to the anal verge. I then ran that stitch longitudinally more distally to the white line of [place]. I then tied that stitch down to cause a hemorrhoidopexy. I did that for all 6 locations.  I redid Doppler anoscopy. I Identified a signal at the right lateral  location.  I isolated and ligated this with a figure-of-eight stitch. Signals went away.  At completion of this, all hemorrhoids were reduced into the rectum. There is no more prolapse. External anatomy revealed a still large right posterior external hemorrhoid pile.  I excised this.  I took a narrow window of anoderm of the hemorrhoid.  I sharply removed the entire hemorrhoid pile off the right lateral sphincter complex.  This markedly improved it.  I ligated the wound with a running 3-0 chromic suture, leaving a 5 mm distal opening for drainage.  At the completion of this, external anatomy was improved.  He had some mild left lateral and right internal/external hemorrhoid swelling.  But he was 90% better, so I decided to avoid being over aggressive.  I repeated anoscopy and examination.  Hemostasis was good.  Patient is being extubated go to recovery room.  I had discussed postop care in detail with the patient in the preop holding area.  Instructions are written.  I am about to discuss the patient's status to the family.

## 2013-04-12 NOTE — Anesthesia Postprocedure Evaluation (Signed)
  Anesthesia Post-op Note  Patient: Guy Mayer  Procedure(s) Performed: Procedure(s) (LRB): TRANSANAL HEMORRHOIDAL DEARTERIALIZATION ligation (N/A)  Patient Location: PACU  Anesthesia Type: General  Level of Consciousness: awake and alert   Airway and Oxygen Therapy: Patient Spontanous Breathing  Post-op Pain: mild  Post-op Assessment: Post-op Vital signs reviewed, Patient's Cardiovascular Status Stable, Respiratory Function Stable, Patent Airway and No signs of Nausea or vomiting  Last Vitals:  Filed Vitals:   04/12/13 0940  BP: 158/94  Pulse: 89  Temp:   Resp: 16    Post-op Vital Signs: stable   Complications: No apparent anesthesia complications

## 2013-04-12 NOTE — H&P (Signed)
Guy Mayer  [birth year] [id number]  CARE TEAM:  PCP: Guy Islam, MD  Outpatient Care Team: Patient Care Team: Guy Islam, MD as PCP - General (Internal Medicine) Guy Belfast, MD as Consulting Physician (Gastroenterology)  Inpatient Treatment Team: Treatment Team: Attending Provider: Ardeth Sportsman, MD  This patient is a 47 y.o.male who presents today for surgical evaluation  Reason for visit: Worsening/persistent hemorrhoids with prolapse bleeding and pain   Pleasant male that is struggled with hemorrhoids for many years. Has tried bandings in the past. Was felt to be a good candidate for PPH stapling in 2010. Due to personal and work issues, he has delayed surgery. However, now things are worsening and he wishes to reconsider. He gets pain with bowel movements. The hemorrhoids with prolapse out. They are very painful when that happens.. Difficult to push back in. Blood as frequent as well. He usually has one bowel movement a morning. He does not take a fiber supplement. No severe dyskinesia. He has had colonoscopy by Dr. Elnoria Mayer in the past. He has not had any treatments done since seen in 2010.  No new events since last visit 6 weeks ago.  No personal nor family history of GI/colon cancer, inflammatory bowel disease, irritable bowel syndrome, allergy such as Celiac Sprue, dietary/dairy problems, colitis, ulcers nor gastritis. No recent sick contacts/gastroenteritis. No travel outside the country. No changes in diet.   Past Medical History  Diagnosis Date  . Hyperlipidemia   . Hypertension     HX OF TAKING BP MED IN PAST  . Anxiety   . Back pain     RECURRENT  . GERD (gastroesophageal reflux disease)   . Hemorrhoids     INTERNAL/EXTERNAL  . Anemia   . Lightheadedness   . Increased thirst   . Difficulty sleeping   . Depression   . Acute sinus infection     Past Surgical History  Procedure Laterality Date  . Vasectomy    . Anterior cervical decomp/discectomy  fusion    . Shoulder arthroscopy    . Elbow arthroscopy    . Knee arthroscopy      History   Social History  . Marital Status: Married    Spouse Name: N/A    Number of Children: N/A  . Years of Education: N/A   Occupational History  . Not on file.   Social History Main Topics  . Smoking status: Current Every Day Smoker -- 0.25 packs/day  . Smokeless tobacco: Never Used  . Alcohol Use: 0.0 oz/week     Comment: 4 SHOTS PER DAY  . Drug Use: No  . Sexual Activity: Not on file   Other Topics Concern  . Not on file   Social History Narrative  . No narrative on file    No family history on file.  Current Facility-Administered Medications  Medication Dose Route Frequency Provider Last Rate Last Dose  . cefOXitin (MEFOXIN) 2 g in dextrose 5 % 50 mL IVPB  2 g Intravenous On Call to OR Guy Sportsman, MD      . chlorhexidine (HIBICLENS) 4 % liquid 1 application  1 application Topical Once Guy Sportsman, MD      . Melene Muller ON 04/13/2013] chlorhexidine (HIBICLENS) 4 % liquid 1 application  1 application Topical Once Guy Sportsman, MD         No Known Allergies  ROS: Constitutional:  No fevers, chills, sweats.  Weight stable Eyes:  No vision changes, No discharge  HENT:  No sore throats, nasal drainage Lymph: No neck swelling, No bruising easily Pulmonary:  No cough, productive sputum CV: No orthopnea, PND   GI: No personal nor family history of GI/colon cancer, inflammatory bowel disease, irritable bowel syndrome, allergy such as Celiac Sprue, dietary/dairy problems, colitis, ulcers nor gastritis.  No recent sick contacts/gastroenteritis.  No travel outside the country.  No changes in diet. Renal: No UTIs, No hematuria Genital:  No drainage, bleeding, masses Musculoskeletal: No severe joint pain.  Good ROM major joints Skin:  No sores or lesions.  No rashes Heme/Lymph:  No easy bleeding.  No swollen lymph nodes Neuro: No focal weakness/numbness.  No seizures Psych: No  suicidal ideation.  No hallucinations  BP 159/97  Pulse 66  Temp(Src) 97.4 F (36.3 C) (Oral)  Resp 18  SpO2 98%  Physical Exam: General: Pt awake/alert/oriented x4 in no major acute distress Eyes: PERRL, normal EOM. Sclera nonicteric Neuro: CN II-XII intact w/o focal sensory/motor deficits. Lymph: No head/neck/groin lymphadenopathy Psych:  No delerium/psychosis/paranoia HENT: Normocephalic, Mucus membranes moist.  No thrush Neck: Supple, No tracheal deviation Chest: No pain.  Good respiratory excursion. CV:  Pulses intact.  Regular rhythm Abdomen: Soft, Nondistended.  Nontender.  No incarcerated hernias. Ext:  SCDs BLE.  No significant edema.  No cyanosis Skin: No petechiae / purpurea.  No major sores Musculoskeletal: No severe joint pain.  Good ROM major joints   Results:   Labs: No results found for this or any previous visit (from the past 48 hour(s)).  Imaging / Studies: No results found.  Medications / Allergies: per chart  Antibiotics: Anti-infectives   Start     Dose/Rate Route Frequency Ordered Stop   04/12/13 0550  cefOXitin (MEFOXIN) 2 g in dextrose 5 % 50 mL IVPB     2 g 100 mL/hr over 30 Minutes Intravenous On call to O.R. 04/12/13 0550 04/13/13 0559      Assessment  Guy Mayer  47 y.o. male  Day of Surgery  Procedure(s): TRANSANAL HEMORRHOIDAL DEARTERIALIZATION ligation possible hemorrhoidectomy   Problem List:  Principal Problem:   Internal hemorrhoids with prolapse & bleeding Active Problems:   External hemorrhoids with pain   Partially prolapsed hemorrhoids with worsening prolapse bleeding and pain, worsening despite rather regular bowel function.   Plan:  THD hem ligation w ext hemorrhoidectomy:  The anatomy & physiology of the anorectal region was discussed.  The pathophysiology of hemorrhoids and differential diagnosis was discussed.  Natural history risks without surgery was discussed.   I stressed the importance of a bowel  regimen to have daily soft bowel movements to minimize progression of disease.  Interventions such as sclerotherapy & banding were discussed.  The patient's symptoms are not adequately controlled by medicines and other non-operative treatments.  I feel the risks & problems of no surgery outweigh the operative risks; therefore, I recommended surgery to treat the hemorrhoids by ligation, pexy, and possible resection.  Risks such as bleeding, infection, urinary difficulties, need for further treatment, heart attack, death, and other risks were discussed.   I noted a good likelihood this will help address the problem.  Goals of post-operative recovery were discussed as well.  Possibility that this will not correct all symptoms was explained.  Post-operative pain, bleeding, constipation, and other problems after surgery were discussed.  We will work to minimize complications.   Educational handouts further explaining the pathology, treatment options, and bowel regimen were given as well.  Questions were answered.  The patient  expresses understanding & wishes to proceed with surgery.     -VTE prophylaxis- SCDs, etc -mobilize as tolerated to help recovery    Guy Mayer, M.D., F.A.C.S. Gastrointestinal and Minimally Invasive Surgery [facility] [address], [address] [place], [place] [address] [phone] Main / Paging   04/12/2013

## 2013-04-12 NOTE — Transfer of Care (Signed)
Immediate Anesthesia Transfer of Care Note  Patient: Guy Mayer  Procedure(s) Performed: Procedure(s): TRANSANAL HEMORRHOIDAL DEARTERIALIZATION ligation (N/A)  Patient Location: PACU  Anesthesia Type:General  Level of Consciousness: awake, sedated and responds to stimulation  Airway & Oxygen Therapy: Patient Spontanous Breathing and Patient connected to face mask oxygen  Post-op Assessment: Report given to PACU RN and Post -op Vital signs reviewed and stable  Post vital signs: Reviewed and stable  Complications: No apparent anesthesia complications

## 2013-04-12 NOTE — Progress Notes (Signed)
asst up to attempt to void w/o success. Became nauseated when up but relieved after lying down. Then doses off to sleep

## 2013-04-12 NOTE — Progress Notes (Signed)
patient states has had sinus infection and saw MD. Was prescribed and antibiotic ( ? Name) and has ben taking it. States he has called Dr Michaell Cowing' office , spoke w a nurse and gave her the name and made her aware

## 2013-04-12 NOTE — Progress Notes (Signed)
Patient up to attempt to  Void w/o success. Became nauseated after back to bed

## 2013-04-12 NOTE — Progress Notes (Signed)
Patient was able to void. Pain managed after pain med. Home with wife at 1440.

## 2013-04-15 ENCOUNTER — Encounter (HOSPITAL_COMMUNITY): Payer: Self-pay | Admitting: Surgery

## 2013-04-17 ENCOUNTER — Encounter (INDEPENDENT_AMBULATORY_CARE_PROVIDER_SITE_OTHER): Payer: Self-pay | Admitting: Surgery

## 2013-04-17 ENCOUNTER — Telehealth (INDEPENDENT_AMBULATORY_CARE_PROVIDER_SITE_OTHER): Payer: Self-pay | Admitting: General Surgery

## 2013-04-17 ENCOUNTER — Telehealth (INDEPENDENT_AMBULATORY_CARE_PROVIDER_SITE_OTHER): Payer: Self-pay | Admitting: *Deleted

## 2013-04-17 ENCOUNTER — Other Ambulatory Visit (INDEPENDENT_AMBULATORY_CARE_PROVIDER_SITE_OTHER): Payer: Self-pay | Admitting: Surgery

## 2013-04-17 DIAGNOSIS — K648 Other hemorrhoids: Secondary | ICD-10-CM

## 2013-04-17 DIAGNOSIS — K644 Residual hemorrhoidal skin tags: Secondary | ICD-10-CM

## 2013-04-17 MED ORDER — OXYCODONE HCL 5 MG PO TABS: 5.0000 mg | ORAL_TABLET | ORAL | Status: DC | PRN

## 2013-04-17 NOTE — Telephone Encounter (Signed)
Called patient to let him know that he has a Rx for Oxycodone 5 mg  Take 1-2 tabs by mouth every 4 hours as needed for pain. Written by Dr Michaell Cowing. Patient is aware that he will need to come by the office to pick up Rx. Rx is up at the front desk

## 2013-04-17 NOTE — Progress Notes (Signed)
OK to renew narcotics as long as patient doubles fiber bowel regimen to avoid constipation.  Continue work to maximize nonnarcotic pain control with over-the-counter anti-inflammatories and sitz baths

## 2013-04-17 NOTE — Telephone Encounter (Signed)
Patient called to request a refill of pain medication.  Patient denies fever, redness, pus drainage, issues with urination, or trouble with BMs at this time.  Patient states the BMs increase his pain which is normal for his type of surgery.  Patient had internal hemorrhoidal surgery on 04/12/13.  Spoke to patient regarding Norco however patient is asking about using the same Oxycodone since it is working for him.  Explained that I will send a message to ask Dr. Michaell Cowing what he would be willing to do then I will let him know.  Patient understands he will have to come and pick up any prescription written.  Patient states understanding and agreeable to plan at this time.

## 2013-04-17 NOTE — Telephone Encounter (Signed)
Refill done.  

## 2013-04-30 ENCOUNTER — Encounter (INDEPENDENT_AMBULATORY_CARE_PROVIDER_SITE_OTHER): Payer: Self-pay | Admitting: Surgery

## 2013-04-30 ENCOUNTER — Ambulatory Visit (INDEPENDENT_AMBULATORY_CARE_PROVIDER_SITE_OTHER): Payer: No Typology Code available for payment source | Admitting: Surgery

## 2013-04-30 VITALS — BP 148/86 | HR 60 | Resp 16 | Ht 70.0 in | Wt 171.2 lb

## 2013-04-30 DIAGNOSIS — K644 Residual hemorrhoidal skin tags: Secondary | ICD-10-CM

## 2013-04-30 DIAGNOSIS — K648 Other hemorrhoids: Secondary | ICD-10-CM

## 2013-04-30 MED ORDER — OXYCODONE HCL 5 MG PO TABS: 5.0000 mg | ORAL_TABLET | ORAL | Status: DC | PRN

## 2013-04-30 NOTE — Patient Instructions (Signed)
HEMORRHOIDS  The rectum is the last foot of your colon, and it naturally stretches to hold stool.  Hemorrhoidal piles are natural clusters of blood vessels that help the rectum and anal canal stretch to hold stool and allow bowel movements to eliminate feces.   Hemorrhoids are abnormally swollen blood vessels in the rectum.  Too much pressure in the rectum causes hemorrhoids by forcing blood to stretch and bulge the walls of the veins, sometimes even rupturing them.  Hemorrhoids can become like varicose veins you might see on a person's legs.  Most people will develop a flare of hemorrhoids in their lifetime.  When bulging hemorrhoidal veins are irritated, they can swell, burn, itch, cause pain, and bleed.  Most flares will calm down gradually own within a few weeks.  However, once hemorrhoids are created, they are difficult to get rid of completely and tend to flare more easily than the first flare.   Fortunately, good habits and simple medical treatment usually control hemorrhoids well, and surgery is needed only in severe cases. Types of Hemorrhoids:  Internal hemorrhoids usually don't initially hurt or itch; they are deep inside the rectum and usually have no sensation. If they begin to push out (prolapse), pain and burning can occur.  However, internal hemorrhoids can bleed.  Anal bleeding should not be ignored since bleeding could come from a dangerous source like colorectal cancer, so persistent rectal bleeding should be investigated by a doctor, sometimes with a colonoscopy.  External hemorrhoids cause most of the symptoms - pain, burning, and itching. Nonirritated hemorrhoids can look like small skin tags coming out of the anus.   Thrombosed hemorrhoids can form when a hemorrhoid blood vessel bursts and causes the hemorrhoid to suddenly swell.  A purple blood clot can form in it and become an excruciatingly painful lump at the anus. Because of these unpleasant symptoms, immediate incision and  drainage by a surgeon at an office visit can provide much relief of the pain.    PREVENTION Avoiding the most frequent causes listed below will prevent most cases of hemorrhoids: Constipation Hard stools Diarrhea  Constant sitting  Straining with bowel movements Sitting on the toilet for a long time  Severe coughing  episodes Pregnancy / Childbirth  Heavy Lifting  Sometimes avoiding the above triggers is difficult:  How can you avoid sitting all day if you have a seated job? Also, we try to avoid coughing and diarrhea, but sometimes it's beyond your control.  Still, there are some practical hints to help: Keep the anal and genital area clean.  Moistened tissues such as flushable wet wipes are less irritating than toilet paper.  Using irrigating showers or bottle irrigation washing gently cleans this sensitive area.   Avoid dry toilet paper when cleaning after bowel movements.  Marland Kitchen Keep the anal and genital area dry.  Lightly pat the rectal area dry.  Avoid rubbing.  Talcum or baby powders can help GET YOUR STOOLS SOFT.   This is the most important way to prevent irritated hemorrhoids.  Hard stools are like sandpaper to the anorectal canal and will cause more problems.  The goal: ONE SOFT BOWEL MOVEMENT A DAY!  BMs from every other day to 3 times a day is a tolerable range Treat coughing, diarrhea and constipation early since irritated hemorrhoids may soon follow.  If your main job activity is seated, always stand or walk during your breaks. Make it a point to stand and walk at least 5 minutes every hour  and try to shift frequently in your chair to avoid direct rectal pressure.  Always exhale as you strain or lift. Don't hold your breath.  Do not delay or try to prevent a bowel movement when the urge is present. Exercise regularly (walking or jogging 60 minutes a day) to stimulate the bowels to move. No reading or other activity while on the toilet. If bowel movements take longer than 5 minutes,  you are too constipated. AVOID CONSTIPATION Drink plenty of liquids (1 1/2 to 2 quarts of water and other fluids a day unless fluid restricted for another medical condition). Liquids that contain caffeine (coffee a, tea, soft drinks) can be dehydrating and should be avoided until constipation is controlled. Consider minimizing milk, as dairy products may be constipating. Eat plenty of fiber (30g a day ideal, more if needed).  Fiber is the undigested part of plant food that passes into the colon, acting as "natures broom" to encourage bowel motility and movement.  Fiber can absorb and hold large amounts of water. This results in a larger, bulkier stool, which is soft and easier to pass.  Eating foods high in fiber - 12 servings - such as  Vegetables: Root (potatoes, carrots, turnips), Leafy green (lettuce, salad greens, celery, spinach), High residue (cabbage, broccoli, etc.) Fruit: Fresh, Dried (prunes, apricots, cherries), Stewed (applesauce)  Whole grain breads, pasta, whole wheat Bran cereals, muffins, etc. Consider adding supplemental bulking fiber which retains large volumes of water: Psyllium ground seeds (native plant from [place])--available as Metamucil, Konsyl, Effersyllium, Per Diem Fiber, or the less expensive generic forms.  Citrucel  (methylcellulose wood fiber) . FiberCon (Polycarbophil) Polyethylene Glycol - and "artificial" fiber commonly called Miralax or Glycolax.  It is helpful for people with gassy or bloated feelings with regular fiber Flax Seed - a less gassy natural fiber  Laxatives can be useful for a short period if constipation is severe Osmotics (Milk of Magnesia, Fleets Phospho-Soda, Magnesium Citrate)  Stimulants (Senokot,   Castor Oil,  Dulcolax, Ex-Lax)    Laxatives are not a good long-term solution as it can stress the bowels and cause too much mineral loss and dehydration.   Avoid taking laxatives for more than 7 days in a row.  AVOID DIARRHEA Switch to  liquids and simpler foods for a few days to avoid stressing your intestines further. Avoid dairy products (especially milk & ice cream) for a short time.  The intestines often can lose the ability to digest lactose when stressed. Avoid foods that cause gassiness or bloating.  Typical foods include beans and other legumes, cabbage, broccoli, and dairy foods.  Every person has some sensitivity to other foods, so listen to your body and avoid those foods that trigger problems for you. Adding fiber (Citrucel, Metamucil, FiberCon, Flax seed, Miralax) gradually can help thicken stools by absorbing excess fluid and retrain the intestines to act more normally.  Slowly increase the dose over a few weeks.  Too much fiber too soon can backfire and cause cramping & bloating. Probiotics (such as active yogurt, Align, etc) may help repopulate the intestines and colon with normal bacteria and calm down a sensitive digestive tract.  Most studies show it to be of mild help, though, and such products can be costly. Medicines: Bismuth subsalicylate (ex. Kayopectate, Pepto Bismol) every 30 minutes for up to 6 doses can help control diarrhea.  Avoid if pregnant. Loperamide (Immodium) can slow down diarrhea.  Start with two tablets (57m total) first and then try one tablet  every 6 hours.  Avoid if you are having fevers or severe pain.  If you are not better or start feeling worse, stop all medicines and call your doctor for advice Call your doctor if you are getting worse or not better.  Sometimes further testing (cultures, endoscopy, X-ray studies, bloodwork, etc) may be needed to help diagnose and treat the cause of the diarrhea. TREATMENT OF HEMORRHOID FLARE If these preventive measures fail, you must take action right away! Hemorrhoids are one condition that can be mild in the morning and become intolerable by nightfall. Most hemorrhoidal flares take several weeks to calm down.  These suggestions can help: Warm soaks.   This helps more than any topical medication.  Use up to 8 times a day.  Usually sitz baths or sitting in a warm bathtub helps.  Sitting on moist warm towels are helpful.  Switching to ice packs/cool compresses can be helpful  Use a Sitz Bath 4-8 times a day for relief A sitz bath is a warm water bath taken in the sitting position that covers only the hips and buttocks. It may be used for either healing or hygiene purposes. Sitz baths are also used to relieve pain, itching, or muscle spasms. The water may contain medicine. Moist heat will help you heal and relax.  HOME CARE INSTRUCTIONS  Take 3 to 4 sitz baths a day. 1. Fill the bathtub half full with warm water. 2. Sit in the water and open the drain a little. 3. Turn on the warm water to keep the tub half full. Keep the water running constantly. 4. Soak in the water for 15 to 20 minutes. 5. After the sitz bath, pat the affected area dry first. SEEK MEDICAL CARE IF:  You get worse instead of better. Stop the sitz baths if you get worse.  Normalize your bowels.  Extremes of diarrhea or constipation will make hemorrhoids worse.  One soft bowel movement a day is the goal.  Fiber can help get your bowels regular Wet wipes instead of toilet paper Pain control with a NSAID such as ibuprofen (Advil) or naproxen (Aleve) or acetaminophen (Tylenol) around the clock.  Narcotics are constipating and should be minimized if possible Topical creams contain steroids (bydrocortisone) or local anesthetic (xylocaine) can help make pain and itching more tolerable.   EVALUATION If hemorrhoids are still causing problems, you could benefit by an evaluation by a surgeon.  The surgeon will obtain a history and examine you.  If hemorrhoids are diagnosed, some therapies can be offered in the office, usually with an anoscope into the less sensitive area of the rectum: -injection of hemorrhoids (sclerotherapy) can scar the blood vessels of the swollen/enlarged hemorrhoids to  help shrink them down to a more normal size -rubber banding of the enlarged hemorrhoids to help shrink them down to a more normal size -drainage of the blood clot causing a thrombosed hemorrhoid,  to relieve the severe pain   While 90% of the time such problems from hemorrhoids can be managed without preceding to surgery, sometimes the hemorrhoids require a operation to control the problem (uncontrolled bleeding, prolapse, pain, etc.).   This involves being placed under general anesthesia where the surgeon can confirm the diagnosis and remove, suture, or staple the hemorrhoid(s).  Your surgeon can help you treat the problem appropriately.     ANORECTAL SURGERY: POST OP INSTRUCTIONS  1. Take your usually prescribed home medications unless otherwise directed. 2. DIET: Follow a light bland diet the first  24 hours after arrival home, such as soup, liquids, crackers, etc.  Be sure to include lots of fluids daily.  Avoid fast food or heavy meals as your are more likely to get nauseated.  Eat a low fat the next few days after surgery.   3. PAIN CONTROL: a. Pain is best controlled by a usual combination of three different methods TOGETHER: i. Ice/Heat ii. Over the counter pain medication iii. Prescription pain medication b. Most patients will experience some swelling and discomfort in the anus/rectal area. and incisions.  Ice packs or heat (30-60 minutes up to 6 times a day) will help. Use ice for the first few days to help decrease swelling and bruising, then switch to heat such as warm towels, sitz baths, warm baths, etc to help relax tight/sore spots and speed recovery.  Some people prefer to use ice alone, heat alone, alternating between ice & heat.  Experiment to what works for you.  Swelling and bruising can take several weeks to resolve.   c. It is helpful to take an over-the-counter pain medication regularly for the first few weeks.  Choose one of the following that works best for you: i. Naproxen  (Aleve, etc)  Two 220mg  tabs twice a day ii. Ibuprofen (Advil, etc) Three 200mg  tabs four times a day (every meal & bedtime) iii. Acetaminophen (Tylenol, etc) 500-650mg  four times a day (every meal & bedtime) d. A  prescription for pain medication (such as oxycodone, hydrocodone, etc) should be given to you upon discharge.  Take your pain medication as prescribed.  i. If you are having problems/concerns with the prescription medicine (does not control pain, nausea, vomiting, rash, itching, etc), please call us [phone] to see if we need to switch you to a different pain medicine that will work better for you and/or control your side effect better. ii. If you need a refill on your pain medication, please contact your pharmacy.  They will contact our office to request authorization. Prescriptions will not be filled after 5 pm or on week-ends.  Use a Sitz Bath 4-8 times a day for relief A sitz bath is a warm water bath taken in the sitting position that covers only the hips and buttocks. It may be used for either healing or hygiene purposes. Sitz baths are also used to relieve pain, itching, or muscle spasms. The water may contain medicine. Moist heat will help you heal and relax.  HOME CARE INSTRUCTIONS  Take 3 to 4 sitz baths a day. 6. Fill the bathtub half full with warm water. 7. Sit in the water and open the drain a little. 8. Turn on the warm water to keep the tub half full. Keep the water running constantly. 9. Soak in the water for 15 to 20 minutes. 10. After the sitz bath, pat the affected area dry first. SEEK MEDICAL CARE IF:  You get worse instead of better. Stop the sitz baths if you get worse.   4. KEEP YOUR BOWELS REGULAR a. The goal is one bowel movement a day b. Avoid getting constipated.  Between the surgery and the pain medications, it is common to experience some constipation.  Increasing fluid intake and taking a fiber supplement (such as Metamucil, Citrucel, FiberCon,  MiraLax, etc) 1-2 times a day regularly will usually help prevent this problem from occurring.  A mild laxative (prune juice, Milk of Magnesia, MiraLax, etc) should be taken according to package directions if there are no bowel movements after 48 hours.  c. Watch out for diarrhea.  If you have many loose bowel movements, simplify your diet to bland foods & liquids for a few days.  Stop any stool softeners and decrease your fiber supplement.  Switching to mild anti-diarrheal medications (Kayopectate, Pepto Bismol) can help.  If this worsens or does not improve, please call us.  5. Wound Care a. Remove your bandages the day after surgery.  Unless discharge instructions indicate otherwise, leave your bandage dry and in place overnight.  Remove the bandage during your first bowel movement.   b. Allow the wound packing to fall out over the next few days.  You can trim exposed gauze / ribbon as it falls out.  You do not need to repack the wound unless instructed otherwise.  Wear an absorbent pad or soft cotton gauze in your underwear as needed to catch any drainage and help keep the area  c. Keep the area clean and dry.  Bathe / shower every day.  Keep the area clean by showering / bathing over the incision / wound.   It is okay to soak an open wound to help wash it.  Wet wipes or showers / gentle washing after bowel movements is often less traumatic than regular toilet paper. d. Bonita Quin may have some styrofoam-like soft packing in the rectum which will come out with the first bowel movement.  e. You will often notice bleeding with bowel movements.  This should slow down by the end of the first week of surgery f. Expect some drainage.  This should slow down, too, by the end of the first week of surgery.  Wear an absorbent pad or soft cotton gauze in your underwear until the drainage stops. 6. ACTIVITIES as tolerated:   a. You may resume regular (light) daily activities beginning the next day-such as daily self-care,  walking, climbing stairs-gradually increasing activities as tolerated.  If you can walk 30 minutes without difficulty, it is safe to try more intense activity such as jogging, treadmill, bicycling, low-impact aerobics, swimming, etc. b. Save the most intensive and strenuous activity for last such as sit-ups, heavy lifting, contact sports, etc  Refrain from any heavy lifting or straining until you are off narcotics for pain control.   c. DO NOT PUSH THROUGH PAIN.  Let pain be your guide: If it hurts to do something, don't do it.  Pain is your body warning you to avoid that activity for another week until the pain goes down. d. You may drive when you are no longer taking prescription pain medication, you can comfortably sit for long periods of time, and you can safely maneuver your car and apply brakes. e. Bonita Quin may have sexual intercourse when it is comfortable.  7. FOLLOW UP in our office a. Please call [facility] at [phone] to set up an appointment to see your surgeon in the office for a follow-up appointment approximately 2 weeks after your surgery. b. Make sure that you call for this appointment the day you arrive home to insure a convenient appointment time. 10. IF YOU HAVE DISABILITY OR FAMILY LEAVE FORMS, BRING THEM TO THE OFFICE FOR PROCESSING.  DO NOT GIVE THEM TO YOUR DOCTOR.        WHEN TO CALL us [phone]: 1. Poor pain control 2. Reactions / problems with new medications (rash/itching, nausea, etc)  3. Fever over 101.5 F (38.5 C) 4. Inability to urinate 5. Nausea and/or vomiting 6. Worsening swelling or bruising 7. Continued bleeding from incision.  8. Increased pain, redness, or drainage from the incision  The clinic staff is available to answer your questions during regular business hours (8:30am-5pm).  Please don't hesitate to call and ask to speak to one of our nurses for clinical concerns.   A surgeon from [facility] is always on call at the hospitals   If  you have a medical emergency, go to the nearest emergency room or call 911.    [facility] [address], [place], [place]  [address] ? MAIN: [phone] ? TOLL FREE: [phone] ? FAX [phone] [email or web address]   GETTING TO GOOD BOWEL HEALTH. Irregular bowel habits such as constipation and diarrhea can lead to many problems over time.  Having one soft bowel movement a day is the most important way to prevent further problems.  The anorectal canal is designed to handle stretching and feces to safely manage our ability to get rid of solid waste (feces, poop, stool) out of our body.  BUT, hard constipated stools can act like ripping concrete bricks and diarrhea can be a burning fire to this very sensitive area of our body, causing inflamed hemorrhoids, anal fissures, increasing risk is perirectal abscesses, abdominal pain/bloating, an making irritable bowel worse.     The goal: ONE SOFT BOWEL MOVEMENT A DAY!  To have soft, regular bowel movements:    Drink at least 8 tall glasses of water a day.     Take plenty of fiber.  Fiber is the undigested part of plant food that passes into the colon, acting s "natures broom" to encourage bowel motility and movement.  Fiber can absorb and hold large amounts of water. This results in a larger, bulkier stool, which is soft and easier to pass. Work gradually over several weeks up to 6 servings a day of fiber (25g a day even more if needed) in the form of: o Vegetables -- Root (potatoes, carrots, turnips), leafy green (lettuce, salad greens, celery, spinach), or cooked high residue (cabbage, broccoli, etc) o Fruit -- Fresh (unpeeled skin & pulp), Dried (prunes, apricots, cherries, etc ),  or stewed ( applesauce)  o Whole grain breads, pasta, etc (whole wheat)  o Bran cereals    Bulking Agents -- This type of water-retaining fiber generally is easily obtained each day by one of the following:  o Psyllium  bran -- The psyllium plant is remarkable because its ground seeds can retain so much water. This product is available as Metamucil, Konsyl, Effersyllium, Per Diem Fiber, or the less expensive generic preparation in drug and health food stores. Although labeled a laxative, it really is not a laxative.  o Methylcellulose -- This is another fiber derived from wood which also retains water. It is available as Citrucel. o Polyethylene Glycol - and "artificial" fiber commonly called Miralax or Glycolax.  It is helpful for people with gassy or bloated feelings with regular fiber o Flax Seed - a less gassy fiber than psyllium   No reading or other relaxing activity while on the toilet. If bowel movements take longer than 5 minutes, you are too constipated   AVOID CONSTIPATION.  High fiber and water intake usually takes care of this.  Sometimes a laxative is needed to stimulate more frequent bowel movements, but    Laxatives are not a good long-term solution as it can wear the colon out. o Osmotics (Milk of Magnesia, Fleets phosphosoda, Magnesium citrate, MiraLax, GoLytely) are safer than  o Stimulants (Senokot, [employer]  Oil, Dulcolax, Ex Lax)    o Do not take laxatives for more than 7days in a row.    IF SEVERELY CONSTIPATED, try a Bowel Retraining Program: o Do not use laxatives.  o Eat a diet high in roughage, such as bran cereals and leafy vegetables.  o Drink six (6) ounces of prune or apricot juice each morning.  o Eat two (2) large servings of stewed fruit each day.  o Take one (1) heaping tablespoon of a psyllium-based bulking agent twice a day. Use sugar-free sweetener when possible to avoid excessive calories.  o Eat a normal breakfast.  o Set aside 15 minutes after breakfast to sit on the toilet, but do not strain to have a bowel movement.  o If you do not have a bowel movement by the third day, use an enema and repeat the above steps.    Controlling diarrhea o Switch to liquids and simpler foods  for a few days to avoid stressing your intestines further. o Avoid dairy products (especially milk & ice cream) for a short time.  The intestines often can lose the ability to digest lactose when stressed. o Avoid foods that cause gassiness or bloating.  Typical foods include beans and other legumes, cabbage, broccoli, and dairy foods.  Every person has some sensitivity to other foods, so listen to our body and avoid those foods that trigger problems for you. o Adding fiber (Citrucel, Metamucil, psyllium, Miralax) gradually can help thicken stools by absorbing excess fluid and retrain the intestines to act more normally.  Slowly increase the dose over a few weeks.  Too much fiber too soon can backfire and cause cramping & bloating. o Probiotics (such as active yogurt, Align, etc) may help repopulate the intestines and colon with normal bacteria and calm down a sensitive digestive tract.  Most studies show it to be of mild help, though, and such products can be costly. o Medicines:   Bismuth subsalicylate (ex. Kayopectate, Pepto Bismol) every 30 minutes for up to 6 doses can help control diarrhea.  Avoid if pregnant.   Loperamide (Immodium) can slow down diarrhea.  Start with two tablets (4mg  total) first and then try one tablet every 6 hours.  Avoid if you are having fevers or severe pain.  If you are not better or start feeling worse, stop all medicines and call your doctor for advice o Call your doctor if you are getting worse or not better.  Sometimes further testing (cultures, endoscopy, X-ray studies, bloodwork, etc) may be needed to help diagnose and treat the cause of the diarrhea.  Managing Pain  Pain after surgery or related to activity is often due to strain/injury to muscle, tendon, nerves and/or incisions.  This pain is usually short-term and will improve in a few months.   Many people find it helpful to do the following things TOGETHER to help speed the process of healing and to get back  to regular activity more quickly:  1. Avoid heavy physical activity a.  no lifting greater than 20 pounds b. Do not "push through" the pain.  Listen to your body and avoid positions and maneuvers than reproduce the pain c. Walking is okay as tolerated, but go slowly and stop when getting sore.  d. Remember: If it hurts to do it, then don't do it! 2. Take Anti-inflammatory medication  a. Take with food/snack around the clock for 1-2 weeks i. This helps the muscle and nerve tissues become less irritable and calm down  faster b. Choose ONE of the following over-the-counter medications: i. Naproxen 220mg  tabs (ex. Aleve) 1-2 pills twice a day  ii. Ibuprofen 200mg  tabs (ex. Advil, Motrin) 3-4 pills with every meal and just before bedtime iii. Acetaminophen 500mg  tabs (Tylenol) 1-2 pills with every meal and just before bedtime 3. Use a Heating pad or Ice/Cold Pack a. 4-6 times a day b. May use warm bath/hottub  or showers 4. Try Gentle Massage and/or Stretching  a. at the area of pain many times a day b. stop if you feel pain - do not overdo it  Try these steps together to help you body heal faster and avoid making things get worse.  Doing just one of these things may not be enough.    If you are not getting better after two weeks or are noticing you are getting worse, contact our office for further advice; we may need to re-evaluate you & see what other things we can do to help.  Hemorrhoids Hemorrhoids are swollen veins around the rectum or anus. There are two types of hemorrhoids:   Internal hemorrhoids. These occur in the veins just inside the rectum. They may poke through to the outside and become irritated and painful.  External hemorrhoids. These occur in the veins outside the anus and can be felt as a painful swelling or hard lump near the anus. CAUSES  Pregnancy.   Obesity.   Constipation or diarrhea.   Straining to have a bowel movement.   Sitting for long periods on  the toilet.  Heavy lifting or other activity that caused you to strain.  Anal intercourse. SYMPTOMS   Pain.   Anal itching or irritation.   Rectal bleeding.   Fecal leakage.   Anal swelling.   One or more lumps around the anus.  DIAGNOSIS  Your caregiver may be able to diagnose hemorrhoids by visual examination. Other examinations or tests that may be performed include:   Examination of the rectal area with a gloved hand (digital rectal exam).   Examination of anal canal using a small tube (scope).   A blood test if you have lost a significant amount of blood.  A test to look inside the colon (sigmoidoscopy or colonoscopy). TREATMENT Most hemorrhoids can be treated at home. However, if symptoms do not seem to be getting better or if you have a lot of rectal bleeding, your caregiver may perform a procedure to help make the hemorrhoids get smaller or remove them completely. Possible treatments include:   Placing a rubber band at the base of the hemorrhoid to cut off the circulation (rubber band ligation).   Injecting a chemical to shrink the hemorrhoid (sclerotherapy).   Using a tool to burn the hemorrhoid (infrared light therapy).   Surgically removing the hemorrhoid (hemorrhoidectomy).   Stapling the hemorrhoid to block blood flow to the tissue (hemorrhoid stapling).  HOME CARE INSTRUCTIONS   Eat foods with fiber, such as whole grains, beans, nuts, fruits, and vegetables. Ask your doctor about taking products with added fiber in them (fibersupplements).  Increase fluid intake. Drink enough water and fluids to keep your urine clear or pale yellow.   Exercise regularly.   Go to the bathroom when you have the urge to have a bowel movement. Do not wait.   Avoid straining to have bowel movements.   Keep the anal area dry and clean. Use wet toilet paper or moist towelettes after a bowel movement.   Medicated creams and suppositories may be  used or  applied as directed.   Only take over-the-counter or prescription medicines as directed by your caregiver.   Take warm sitz baths for 15 20 minutes, 3 4 times a day to ease pain and discomfort.   Place ice packs on the hemorrhoids if they are tender and swollen. Using ice packs between sitz baths may be helpful.   Put ice in a plastic bag.   Place a towel between your skin and the bag.   Leave the ice on for 15 20 minutes, 3 4 times a day.   Do not use a donut-shaped pillow or sit on the toilet for long periods. This increases blood pooling and pain.  SEEK MEDICAL CARE IF:  You have increasing pain and swelling that is not controlled by treatment or medicine.  You have uncontrolled bleeding.  You have difficulty or you are unable to have a bowel movement.  You have pain or inflammation outside the area of the hemorrhoids. MAKE SURE YOU: 11. Understand these instructions. 12. Will watch your condition. 13. Will get help right away if you are not doing well or get worse. Document Released: 06/17/2000 Document Revised: 06/06/2012 Document Reviewed: 04/24/2012 [facility] Patient Information 2014 [place], [place].

## 2013-04-30 NOTE — Progress Notes (Signed)
Subjective:     Patient ID: Guy Mayer, male   DOB: 12-05-1965, 47 y.o.   MRN: [id number]  HPI  Guy Mayer  May 10, 1966 [id number]  Patient Care Team: Lavell Islam, MD as PCP - General (Internal Medicine) Theda Belfast, MD as Consulting Physician (Gastroenterology)  Procedure (Date: 04/12/2013):  POST-OPERATIVE DIAGNOSIS: internal and external hemorrhoids with pain, bleeding, and prolaspe   PROCEDURE: Procedure(s):  TRANSANAL HEMORRHOIDAL DEARTERIALIZATION ligation  External hemorrhoidectomy x1 pile (right posterior)   SURGEON: Surgeon(s):  Ardeth Sportsman, MD  This patient returns for surgical re-evaluation.  He had moderate soreness but is going down.  He is trying to wean himself off narcotics but not quite yet.  Bleeding fading away.  Not back on fiber supplement.  No fevers or chills.  Energy level getting better.  Hoping to get back to exercising.  Patient Active Problem List   Diagnosis Date Noted  . Internal hemorrhoids with prolapse & bleeding 02/18/2013  . External hemorrhoids with pain 02/18/2013    Past Medical History  Diagnosis Date  . Hyperlipidemia   . Hypertension     HX OF TAKING BP MED IN PAST  . Anxiety   . Back pain     RECURRENT  . GERD (gastroesophageal reflux disease)   . Hemorrhoids     INTERNAL/EXTERNAL  . Anemia   . Lightheadedness   . Increased thirst   . Difficulty sleeping   . Depression   . Acute sinus infection     Past Surgical History  Procedure Laterality Date  . Vasectomy    . Anterior cervical decomp/discectomy fusion    . Shoulder arthroscopy    . Elbow arthroscopy    . Knee arthroscopy    . Transanal hemorrhoidal dearterialization N/A 04/12/2013    Procedure: TRANSANAL HEMORRHOIDAL DEARTERIALIZATION ligation;  Surgeon: Ardeth Sportsman, MD;  Location: [facility];  Service: General;  Laterality: N/A;    History   Social History  . Marital Status: Married    Spouse Name: N/A    Number of Children: N/A  .  Years of Education: N/A   Occupational History  . Not on file.   Social History Main Topics  . Smoking status: Current Every Day Smoker -- 0.25 packs/day  . Smokeless tobacco: Never Used  . Alcohol Use: 0.0 oz/week     Comment: 4 SHOTS PER DAY  . Drug Use: No  . Sexual Activity: Not on file   Other Topics Concern  . Not on file   Social History Narrative  . No narrative on file    History reviewed. No pertinent family history.  Current Outpatient Prescriptions  Medication Sig Dispense Refill  . dexlansoprazole (DEXILANT) 60 MG capsule Take 60 mg by mouth daily.      . fexofenadine (ALLEGRA) 180 MG tablet Take 180 mg by mouth daily.      . Lidocaine-Hydrocortisone Ace (ANAMANTLE HC) 3-2.5 % KIT Apply 1 application topically 4 (four) times daily as needed (for anal pain / irritation).  1 each  2  . naproxen (NAPROSYN) 500 MG tablet Take 1 tablet (500 mg total) by mouth 2 (two) times daily with a meal.  40 tablet  1  . pseudoephedrine (SUDAFED) 120 MG 12 hr tablet Take 120 mg by mouth daily.      Marland Kitchen oxyCODONE (OXY IR/ROXICODONE) 5 MG immediate release tablet Take 1-2 tablets (5-10 mg total) by mouth every 4 (four) hours as needed for pain.  50 tablet  0   No current facility-administered medications for this visit.     No Known Allergies  BP 148/86  Pulse 60  Resp 16  Ht 5\' 10"  (1.778 m)  Wt 171 lb 3.2 oz (77.656 kg)  BMI 24.56 kg/m2  No results found.  Review of Systems  Constitutional: Negative for fever, chills and diaphoresis.  HENT: Negative for sore throat and trouble swallowing.   Eyes: Negative for photophobia and visual disturbance.  Respiratory: Negative for choking and shortness of breath.   Cardiovascular: Negative for chest pain and palpitations.  Gastrointestinal: Positive for rectal pain. Negative for nausea, vomiting, abdominal pain, diarrhea, constipation, abdominal distention and anal bleeding.  Genitourinary: Negative for dysuria, urgency, difficulty  urinating and testicular pain.  Musculoskeletal: Negative for arthralgias, gait problem, myalgias and neck pain.  Skin: Negative for color change and rash.  Neurological: Negative for dizziness, speech difficulty, weakness and numbness.  Hematological: Negative for adenopathy.  Psychiatric/Behavioral: Negative for hallucinations, confusion and agitation.       Objective:   Physical Exam  Constitutional: He is oriented to person, place, and time. He appears well-developed and well-nourished. No distress.  HENT:  Head: Normocephalic.  Mouth/Throat: Oropharynx is clear and moist. No oropharyngeal exudate.  Eyes: Conjunctivae and EOM are normal. Pupils are equal, round, and reactive to light. No scleral icterus.  Neck: Normal range of motion. No tracheal deviation present.  Cardiovascular: Normal rate, normal heart sounds and intact distal pulses.   Pulmonary/Chest: Effort normal. No respiratory distress.  Abdominal: Soft. He exhibits no distension. There is no tenderness. Hernia confirmed negative in the right inguinal area and confirmed negative in the left inguinal area.  No hernias  Genitourinary:  Exam done with assistance of male Medical Assistant in the room.  Perianal skin clean with good hygiene.  No pruritis.  No pilonidal disease.  No fissure.  No abscess/fistula.    No external skin tags / hemorrhoids of significance.  Mild swelling of perianal region but no abscess.  Mildly increased tone but in normal range.  Held off on digital or anoscopic exam at this time   Musculoskeletal: Normal range of motion. He exhibits no tenderness.  Neurological: He is alert and oriented to person, place, and time. No cranial nerve deficit. He exhibits normal muscle tone. Coordination normal.  Skin: Skin is warm and dry. No rash noted. He is not diaphoretic.  Psychiatric: He has a normal mood and affect. His behavior is normal.       Assessment:     Recovered relatively well from [facility]  hemorrhoidal ligation and external hemorrhoidectomies.     Plan:     Increase activity as tolerated to regular activity.  Low impact exercise such as walking an hour a day at least ideal.  Do not push through pain.  Regular hemorrhoidal prevention/maintenance: The anatomy & physiology of the anorectal region was discussed.  The pathophysiology of hemorrhoids and differential diagnosis was discussed.  Natural history progression  was discussed.   I stressed the importance of a bowel regimen to have daily soft bowel movements to minimize progression of disease.   Goal of one BM / day ideal.  Use of wet wipes, warm baths, avoiding straining, etc were emphasized.  Educational handouts further explaining the pathology, treatment options, and bowel regimen were given as well.   The patient expressed understanding.  Return to clinic 3-4 weeks, otherwise if marked improved can be as needed.   Instructions discussed.  Followup with primary care physician  for other health issues as would normally be done.  Questions answered.  The patient expressed understanding and appreciation

## 2013-05-09 ENCOUNTER — Other Ambulatory Visit: Payer: Self-pay

## 2013-05-20 ENCOUNTER — Ambulatory Visit (INDEPENDENT_AMBULATORY_CARE_PROVIDER_SITE_OTHER): Payer: No Typology Code available for payment source | Admitting: Surgery

## 2013-05-20 ENCOUNTER — Encounter (INDEPENDENT_AMBULATORY_CARE_PROVIDER_SITE_OTHER): Payer: Self-pay | Admitting: Surgery

## 2013-05-20 VITALS — BP 140/88 | HR 84 | Temp 97.8°F | Resp 16 | Ht 70.0 in | Wt 172.6 lb

## 2013-05-20 DIAGNOSIS — K648 Other hemorrhoids: Secondary | ICD-10-CM

## 2013-05-20 DIAGNOSIS — K644 Residual hemorrhoidal skin tags: Secondary | ICD-10-CM

## 2013-05-20 MED ORDER — OXYCODONE HCL 5 MG PO TABS: 5.0000 mg | ORAL_TABLET | ORAL | Status: DC | PRN

## 2013-05-20 NOTE — Progress Notes (Signed)
Subjective:     Patient ID: Guy Mayer, male   DOB: 1965/09/06, 47 y.o.   MRN: [id number]  HPI  Guy Mayer  [birth year] [id number]  Patient Care Team: Lavell Islam, MD as PCP - General (Internal Medicine) Theda Belfast, MD as Consulting Physician (Gastroenterology)  Procedure (Date: 04/12/2013):  POST-OPERATIVE DIAGNOSIS: internal and external hemorrhoids with pain, bleeding, and prolaspe   PROCEDURE: Procedure(s):  TRANSANAL HEMORRHOIDAL DEARTERIALIZATION ligation  External hemorrhoidectomy x1 pile (right posterior)   SURGEON:  Ardeth Sportsman, MD  Diagnosis Hemorrhoids - BENIGN SQUAMOUS MUCOSA WITH UNDERLYING BENIGN DILATED VESSELS CONSISTENT WITH CLINICAL IMPRESSION OF HEMORRHOIDS. - NO DYSPLASIA, ATYPIA OR MALIGNANCY IDENTIFIED. Zandra Abts MD Pathologist, Electronic Signature (Case signed 04/15/2013)  This patient returns for surgical re-evaluation.  He started doing better.  Sore the first few days but gradually improving.  Trying to keep the use of narcotics minimal but still occasionally needs them.  He was hoping for a refill.  [place]  Felt sore with each side there and back.  Having about 3 soft bowel movements a day.  Not horrible constipation nor diarrhea.  He is taking his Metamucil fiber supplement.  Bleeding minimal.  Feels like he is getting better.  Patient Active Problem List   Diagnosis Date Noted  . Internal hemorrhoids with prolapse & bleeding 02/18/2013  . External hemorrhoids with pain 02/18/2013    Past Medical History  Diagnosis Date  . Hyperlipidemia   . Hypertension     HX OF TAKING BP MED IN PAST  . Anxiety   . Back pain     RECURRENT  . GERD (gastroesophageal reflux disease)   . Hemorrhoids     INTERNAL/EXTERNAL  . Anemia   . Lightheadedness   . Increased thirst   . Difficulty sleeping   . Depression   . Acute sinus infection     Past Surgical History  Procedure  Laterality Date  . Vasectomy    . Anterior cervical decomp/discectomy fusion    . Shoulder arthroscopy    . Elbow arthroscopy    . Knee arthroscopy    . Transanal hemorrhoidal dearterialization N/A 04/12/2013    Procedure: TRANSANAL HEMORRHOIDAL DEARTERIALIZATION ligation;  Surgeon: Ardeth Sportsman, MD;  Location: [facility];  Service: General;  Laterality: N/A;    History   Social History  . Marital Status: Married    Spouse Name: N/A    Number of Children: N/A  . Years of Education: N/A   Occupational History  . Not on file.   Social History Main Topics  . Smoking status: Current Every Day Smoker -- 0.25 packs/day  . Smokeless tobacco: Never Used  . Alcohol Use: 0.0 oz/week     Comment: 4 SHOTS PER DAY  . Drug Use: No  . Sexual Activity: Not on file   Other Topics Concern  . Not on file   Social History Narrative  . No narrative on file    History reviewed. No pertinent family history.  Current Outpatient Prescriptions  Medication Sig Dispense Refill  . acetaminophen (TYLENOL) 325 MG tablet Take 650 mg by mouth every 6 (six) hours as needed.      Marland Kitchen dexlansoprazole (DEXILANT) 60 MG capsule Take 60 mg by mouth daily.      . fexofenadine (ALLEGRA) 180 MG tablet Take 180 mg by mouth daily.      . pseudoephedrine (SUDAFED) 120 MG 12  hr tablet Take 120 mg by mouth daily.      . Lidocaine-Hydrocortisone Ace (ANAMANTLE HC) 3-2.5 % KIT Apply 1 application topically 4 (four) times daily as needed (for anal pain / irritation).  1 each  2   No current facility-administered medications for this visit.     No Known Allergies  BP 140/88  Pulse 84  Temp(Src) 97.8 F (36.6 C) (Temporal)  Resp 16  Ht 5\' 10"  (1.778 m)  Wt 172 lb 9.6 oz (78.291 kg)  BMI 24.77 kg/m2  No results found.   Review of Systems  Constitutional: Negative for fever, chills and diaphoresis.  HENT: Negative for sore throat and trouble swallowing.   Eyes: Negative for photophobia and visual  disturbance.  Respiratory: Negative for choking and shortness of breath.   Cardiovascular: Negative for chest pain and palpitations.  Gastrointestinal: Positive for anal bleeding and rectal pain. Negative for nausea, vomiting and abdominal distention.  Genitourinary: Negative for dysuria, urgency, difficulty urinating and testicular pain.  Musculoskeletal: Negative for arthralgias, gait problem, myalgias and neck pain.  Skin: Negative for color change and rash.  Neurological: Negative for dizziness, speech difficulty, weakness and numbness.  Hematological: Negative for adenopathy.  Psychiatric/Behavioral: Negative for hallucinations, confusion and agitation.       Objective:   Physical Exam  Constitutional: He is oriented to person, place, and time. He appears well-developed and well-nourished. No distress.  HENT:  Head: Normocephalic.  Mouth/Throat: Oropharynx is clear and moist. No oropharyngeal exudate.  Eyes: Conjunctivae and EOM are normal. Pupils are equal, round, and reactive to light. No scleral icterus.  Neck: Normal range of motion. No tracheal deviation present.  Cardiovascular: Normal rate, normal heart sounds and intact distal pulses.   Pulmonary/Chest: Effort normal. No respiratory distress.  Abdominal: Soft. He exhibits no distension. There is no tenderness. Hernia confirmed negative in the right inguinal area and confirmed negative in the left inguinal area.  Genitourinary:  No perianal wound.  Mild sensitivity.  No abscess.  Musculoskeletal: Normal range of motion. He exhibits no tenderness.  Neurological: He is alert and oriented to person, place, and time. No cranial nerve deficit. He exhibits normal muscle tone. Coordination normal.  Skin: Skin is warm and dry. No rash noted. He is not diaphoretic.  Psychiatric: He has a normal mood and affect. His behavior is normal.       Assessment:     Recovering from [facility] internal hemorrhoidal ligation/pexied with removal of  external hemorrhoids.  Recovering.  Mild loose bowel movements but better controlled with Metamucil     Plan:     Increase activity as tolerated to regular activity.  Low impact exercise such as walking an hour a day at least ideal.  Do not push through pain.  I renewed 30 more oxycodone for him.  Diet as tolerated.  Low fat high fiber diet ideal.  Bowel regimen with 30 g fiber a day and fiber supplement as needed to avoid problems.  Consider Kaopectate to help thicken up stools.  Reserve Imodium if getting worse.  Return to clinic as needed.   Instructions discussed.  Followup with primary care physician for other health issues as would normally be done.  Questions answered.  The patient expressed understanding and appreciation

## 2013-05-20 NOTE — Patient Instructions (Signed)
HEMORRHOIDS  The rectum is the last foot of your colon, and it naturally stretches to hold stool.  Hemorrhoidal piles are natural clusters of blood vessels that help the rectum and anal canal stretch to hold stool and allow bowel movements to eliminate feces.   Hemorrhoids are abnormally swollen blood vessels in the rectum.  Too much pressure in the rectum causes hemorrhoids by forcing blood to stretch and bulge the walls of the veins, sometimes even rupturing them.  Hemorrhoids can become like varicose veins you might see on a person's legs.  Most people will develop a flare of hemorrhoids in their lifetime.  When bulging hemorrhoidal veins are irritated, they can swell, burn, itch, cause pain, and bleed.  Most flares will calm down gradually own within a few weeks.  However, once hemorrhoids are created, they are difficult to get rid of completely and tend to flare more easily than the first flare.   Fortunately, good habits and simple medical treatment usually control hemorrhoids well, and surgery is needed only in severe cases. Types of Hemorrhoids:  Internal hemorrhoids usually don't initially hurt or itch; they are deep inside the rectum and usually have no sensation. If they begin to push out (prolapse), pain and burning can occur.  However, internal hemorrhoids can bleed.  Anal bleeding should not be ignored since bleeding could come from a dangerous source like colorectal cancer, so persistent rectal bleeding should be investigated by a doctor, sometimes with a colonoscopy.  External hemorrhoids cause most of the symptoms - pain, burning, and itching. Nonirritated hemorrhoids can look like small skin tags coming out of the anus.   Thrombosed hemorrhoids can form when a hemorrhoid blood vessel bursts and causes the hemorrhoid to suddenly swell.  A purple blood clot can form in it and become an excruciatingly painful lump at the anus. Because of these unpleasant symptoms, immediate incision and  drainage by a surgeon at an office visit can provide much relief of the pain.    PREVENTION Avoiding the most frequent causes listed below will prevent most cases of hemorrhoids: Constipation Hard stools Diarrhea  Constant sitting  Straining with bowel movements Sitting on the toilet for a long time  Severe coughing  episodes Pregnancy / Childbirth  Heavy Lifting  Sometimes avoiding the above triggers is difficult:  How can you avoid sitting all day if you have a seated job? Also, we try to avoid coughing and diarrhea, but sometimes it's beyond your control.  Still, there are some practical hints to help: Keep the anal and genital area clean.  Moistened tissues such as flushable wet wipes are less irritating than toilet paper.  Using irrigating showers or bottle irrigation washing gently cleans this sensitive area.   Avoid dry toilet paper when cleaning after bowel movements.  Marland Kitchen Keep the anal and genital area dry.  Lightly pat the rectal area dry.  Avoid rubbing.  Talcum or baby powders can help GET YOUR STOOLS SOFT.   This is the most important way to prevent irritated hemorrhoids.  Hard stools are like sandpaper to the anorectal canal and will cause more problems.  The goal: ONE SOFT BOWEL MOVEMENT A DAY!  BMs from every other day to 3 times a day is a tolerable range Treat coughing, diarrhea and constipation early since irritated hemorrhoids may soon follow.  If your main job activity is seated, always stand or walk during your breaks. Make it a point to stand and walk at least 5 minutes every hour  and try to shift frequently in your chair to avoid direct rectal pressure.  Always exhale as you strain or lift. Don't hold your breath.  Do not delay or try to prevent a bowel movement when the urge is present. Exercise regularly (walking or jogging 60 minutes a day) to stimulate the bowels to move. No reading or other activity while on the toilet. If bowel movements take longer than 5 minutes,  you are too constipated. AVOID CONSTIPATION Drink plenty of liquids (1 1/2 to 2 quarts of water and other fluids a day unless fluid restricted for another medical condition). Liquids that contain caffeine (coffee a, tea, soft drinks) can be dehydrating and should be avoided until constipation is controlled. Consider minimizing milk, as dairy products may be constipating. Eat plenty of fiber (30g a day ideal, more if needed).  Fiber is the undigested part of plant food that passes into the colon, acting as "natures broom" to encourage bowel motility and movement.  Fiber can absorb and hold large amounts of water. This results in a larger, bulkier stool, which is soft and easier to pass.  Eating foods high in fiber - 12 servings - such as  Vegetables: Root (potatoes, carrots, turnips), Leafy green (lettuce, salad greens, celery, spinach), High residue (cabbage, broccoli, etc.) Fruit: Fresh, Dried (prunes, apricots, cherries), Stewed (applesauce)  Whole grain breads, pasta, whole wheat Bran cereals, muffins, etc. Consider adding supplemental bulking fiber which retains large volumes of water: Psyllium ground seeds (native plant from [place])--available as Metamucil, Konsyl, Effersyllium, Per Diem Fiber, or the less expensive generic forms.  Citrucel  (methylcellulose wood fiber) . FiberCon (Polycarbophil) Polyethylene Glycol - and "artificial" fiber commonly called Miralax or Glycolax.  It is helpful for people with gassy or bloated feelings with regular fiber Flax Seed - a less gassy natural fiber  Laxatives can be useful for a short period if constipation is severe Osmotics (Milk of Magnesia, Fleets Phospho-Soda, Magnesium Citrate)  Stimulants (Senokot,   Castor Oil,  Dulcolax, Ex-Lax)    Laxatives are not a good long-term solution as it can stress the bowels and cause too much mineral loss and dehydration.   Avoid taking laxatives for more than 7 days in a row.  AVOID DIARRHEA Switch to  liquids and simpler foods for a few days to avoid stressing your intestines further. Avoid dairy products (especially milk & ice cream) for a short time.  The intestines often can lose the ability to digest lactose when stressed. Avoid foods that cause gassiness or bloating.  Typical foods include beans and other legumes, cabbage, broccoli, and dairy foods.  Every person has some sensitivity to other foods, so listen to your body and avoid those foods that trigger problems for you. Adding fiber (Citrucel, Metamucil, FiberCon, Flax seed, Miralax) gradually can help thicken stools by absorbing excess fluid and retrain the intestines to act more normally.  Slowly increase the dose over a few weeks.  Too much fiber too soon can backfire and cause cramping & bloating. Probiotics (such as active yogurt, Align, etc) may help repopulate the intestines and colon with normal bacteria and calm down a sensitive digestive tract.  Most studies show it to be of mild help, though, and such products can be costly. Medicines: Bismuth subsalicylate (ex. Kayopectate, Pepto Bismol) every 30 minutes for up to 6 doses can help control diarrhea.  Avoid if pregnant. Loperamide (Immodium) can slow down diarrhea.  Start with two tablets (57m total) first and then try one tablet  every 6 hours.  Avoid if you are having fevers or severe pain.  If you are not better or start feeling worse, stop all medicines and call your doctor for advice Call your doctor if you are getting worse or not better.  Sometimes further testing (cultures, endoscopy, X-ray studies, bloodwork, etc) may be needed to help diagnose and treat the cause of the diarrhea. TREATMENT OF HEMORRHOID FLARE If these preventive measures fail, you must take action right away! Hemorrhoids are one condition that can be mild in the morning and become intolerable by nightfall. Most hemorrhoidal flares take several weeks to calm down.  These suggestions can help: Warm soaks.   This helps more than any topical medication.  Use up to 8 times a day.  Usually sitz baths or sitting in a warm bathtub helps.  Sitting on moist warm towels are helpful.  Switching to ice packs/cool compresses can be helpful  Use a Sitz Bath 4-8 times a day for relief A sitz bath is a warm water bath taken in the sitting position that covers only the hips and buttocks. It may be used for either healing or hygiene purposes. Sitz baths are also used to relieve pain, itching, or muscle spasms. The water may contain medicine. Moist heat will help you heal and relax.  HOME CARE INSTRUCTIONS  Take 3 to 4 sitz baths a day. 1. Fill the bathtub half full with warm water. 2. Sit in the water and open the drain a little. 3. Turn on the warm water to keep the tub half full. Keep the water running constantly. 4. Soak in the water for 15 to 20 minutes. 5. After the sitz bath, pat the affected area dry first. SEEK MEDICAL CARE IF:  You get worse instead of better. Stop the sitz baths if you get worse.  Normalize your bowels.  Extremes of diarrhea or constipation will make hemorrhoids worse.  One soft bowel movement a day is the goal.  Fiber can help get your bowels regular Wet wipes instead of toilet paper Pain control with a NSAID such as ibuprofen (Advil) or naproxen (Aleve) or acetaminophen (Tylenol) around the clock.  Narcotics are constipating and should be minimized if possible Topical creams contain steroids (bydrocortisone) or local anesthetic (xylocaine) can help make pain and itching more tolerable.   EVALUATION If hemorrhoids are still causing problems, you could benefit by an evaluation by a surgeon.  The surgeon will obtain a history and examine you.  If hemorrhoids are diagnosed, some therapies can be offered in the office, usually with an anoscope into the less sensitive area of the rectum: -injection of hemorrhoids (sclerotherapy) can scar the blood vessels of the swollen/enlarged hemorrhoids to  help shrink them down to a more normal size -rubber banding of the enlarged hemorrhoids to help shrink them down to a more normal size -drainage of the blood clot causing a thrombosed hemorrhoid,  to relieve the severe pain   While 90% of the time such problems from hemorrhoids can be managed without preceding to surgery, sometimes the hemorrhoids require a operation to control the problem (uncontrolled bleeding, prolapse, pain, etc.).   This involves being placed under general anesthesia where the surgeon can confirm the diagnosis and remove, suture, or staple the hemorrhoid(s).  Your surgeon can help you treat the problem appropriately.    GETTING TO GOOD BOWEL HEALTH. Irregular bowel habits such as constipation and diarrhea can lead to many problems over time.  Having one soft bowel movement  a day is the most important way to prevent further problems.  The anorectal canal is designed to handle stretching and feces to safely manage our ability to get rid of solid waste (feces, poop, stool) out of our body.  BUT, hard constipated stools can act like ripping concrete bricks and diarrhea can be a burning fire to this very sensitive area of our body, causing inflamed hemorrhoids, anal fissures, increasing risk is perirectal abscesses, abdominal pain/bloating, an making irritable bowel worse.     The goal: ONE SOFT BOWEL MOVEMENT A DAY!  To have soft, regular bowel movements:    Drink at least 8 tall glasses of water a day.     Take plenty of fiber.  Fiber is the undigested part of plant food that passes into the colon, acting s "natures broom" to encourage bowel motility and movement.  Fiber can absorb and hold large amounts of water. This results in a larger, bulkier stool, which is soft and easier to pass. Work gradually over several weeks up to 6 servings a day of fiber (25g a day even more if needed) in the form of: o Vegetables -- Root (potatoes, carrots, turnips), leafy green (lettuce, salad greens,  celery, spinach), or cooked high residue (cabbage, broccoli, etc) o Fruit -- Fresh (unpeeled skin & pulp), Dried (prunes, apricots, cherries, etc ),  or stewed ( applesauce)  o Whole grain breads, pasta, etc (whole wheat)  o Bran cereals    Bulking Agents -- This type of water-retaining fiber generally is easily obtained each day by one of the following:  o Psyllium bran -- The psyllium plant is remarkable because its ground seeds can retain so much water. This product is available as Metamucil, Konsyl, Effersyllium, Per Diem Fiber, or the less expensive generic preparation in drug and health food stores. Although labeled a laxative, it really is not a laxative.  o Methylcellulose -- This is another fiber derived from wood which also retains water. It is available as Citrucel. o Polyethylene Glycol - and "artificial" fiber commonly called Miralax or Glycolax.  It is helpful for people with gassy or bloated feelings with regular fiber o Flax Seed - a less gassy fiber than psyllium   No reading or other relaxing activity while on the toilet. If bowel movements take longer than 5 minutes, you are too constipated   AVOID CONSTIPATION.  High fiber and water intake usually takes care of this.  Sometimes a laxative is needed to stimulate more frequent bowel movements, but    Laxatives are not a good long-term solution as it can wear the colon out. o Osmotics (Milk of Magnesia, Fleets phosphosoda, Magnesium citrate, MiraLax, GoLytely) are safer than  o Stimulants (Senokot, Castor Oil, Dulcolax, Ex Lax)    o Do not take laxatives for more than 7days in a row.    IF SEVERELY CONSTIPATED, try a Bowel Retraining Program: o Do not use laxatives.  o Eat a diet high in roughage, such as bran cereals and leafy vegetables.  o Drink six (6) ounces of prune or apricot juice each morning.  o Eat two (2) large servings of stewed fruit each day.  o Take one (1) heaping tablespoon of a psyllium-based bulking agent  twice a day. Use sugar-free sweetener when possible to avoid excessive calories.  o Eat a normal breakfast.  o Set aside 15 minutes after breakfast to sit on the toilet, but do not strain to have a bowel movement.  o If you do   not have a bowel movement by the third day, use an enema and repeat the above steps.    Controlling diarrhea o Switch to liquids and simpler foods for a few days to avoid stressing your intestines further. o Avoid dairy products (especially milk & ice cream) for a short time.  The intestines often can lose the ability to digest lactose when stressed. o Avoid foods that cause gassiness or bloating.  Typical foods include beans and other legumes, cabbage, broccoli, and dairy foods.  Every person has some sensitivity to other foods, so listen to our body and avoid those foods that trigger problems for you. o Adding fiber (Citrucel, Metamucil, psyllium, Miralax) gradually can help thicken stools by absorbing excess fluid and retrain the intestines to act more normally.  Slowly increase the dose over a few weeks.  Too much fiber too soon can backfire and cause cramping & bloating. o Probiotics (such as active yogurt, Align, etc) may help repopulate the intestines and colon with normal bacteria and calm down a sensitive digestive tract.  Most studies show it to be of mild help, though, and such products can be costly. o Medicines:   Bismuth subsalicylate (ex. Kayopectate, Pepto Bismol) every 30 minutes for up to 6 doses can help control diarrhea.  Avoid if pregnant.   Loperamide (Immodium) can slow down diarrhea.  Start with two tablets (4mg  total) first and then try one tablet every 6 hours.  Avoid if you are having fevers or severe pain.  If you are not better or start feeling worse, stop all medicines and call your doctor for advice o Call your doctor if you are getting worse or not better.  Sometimes further testing (cultures, endoscopy, X-ray studies, bloodwork, etc) may be needed  to help diagnose and treat the cause of the diarrhea.  Managing Pain  Pain after surgery or related to activity is often due to strain/injury to muscle, tendon, nerves and/or incisions.  This pain is usually short-term and will improve in a few months.   Many people find it helpful to do the following things TOGETHER to help speed the process of healing and to get back to regular activity more quickly:  1. Avoid heavy physical activity a.  no lifting greater than 20 pounds b. Do not "push through" the pain.  Listen to your body and avoid positions and maneuvers than reproduce the pain c. Walking is okay as tolerated, but go slowly and stop when getting sore.  d. Remember: If it hurts to do it, then don't do it! 2. Take Anti-inflammatory medication  a. Take with food/snack around the clock for 1-2 weeks i. This helps the muscle and nerve tissues become less irritable and calm down faster b. Choose ONE of the following over-the-counter medications: i. Naproxen 220mg  tabs (ex. Aleve) 1-2 pills twice a day  ii. Ibuprofen 200mg  tabs (ex. Advil, Motrin) 3-4 pills with every meal and just before bedtime iii. Acetaminophen 500mg  tabs (Tylenol) 1-2 pills with every meal and just before bedtime 3. Use a Heating pad or Ice/Cold Pack a. 4-6 times a day b. May use warm bath/hottub  or showers 4. Try Gentle Massage and/or Stretching  a. at the area of pain many times a day b. stop if you feel pain - do not overdo it  Try these steps together to help you body heal faster and avoid making things get worse.  Doing just one of these things may not be enough.    If you  are not getting better after two weeks or are noticing you are getting worse, contact our office for further advice; we may need to re-evaluate you & see what other things we can do to help.

## 2018-08-17 ENCOUNTER — Ambulatory Visit: Payer: Self-pay | Admitting: Physician Assistant

## 2018-09-10 ENCOUNTER — Other Ambulatory Visit: Payer: Self-pay | Admitting: Physician Assistant

## 2018-09-10 MED ORDER — ZOLPIDEM TARTRATE 10 MG PO TABS: 10.0000 mg | ORAL_TABLET | Freq: Every evening | ORAL | 0 refills | Status: DC | PRN

## 2018-09-10 MED ORDER — ALPRAZOLAM 1 MG PO TABS: 1.0000 mg | ORAL_TABLET | Freq: Two times a day (BID) | ORAL | 0 refills | Status: DC | PRN

## 2018-09-21 ENCOUNTER — Encounter: Payer: Self-pay | Admitting: Physician Assistant

## 2018-09-21 ENCOUNTER — Ambulatory Visit (INDEPENDENT_AMBULATORY_CARE_PROVIDER_SITE_OTHER): Payer: BLUE CROSS/BLUE SHIELD | Admitting: Physician Assistant

## 2018-09-21 ENCOUNTER — Other Ambulatory Visit: Payer: Self-pay

## 2018-09-21 DIAGNOSIS — F319 Bipolar disorder, unspecified: Secondary | ICD-10-CM | POA: Diagnosis not present

## 2018-09-21 DIAGNOSIS — F411 Generalized anxiety disorder: Secondary | ICD-10-CM

## 2018-09-21 DIAGNOSIS — G4700 Insomnia, unspecified: Secondary | ICD-10-CM | POA: Diagnosis not present

## 2018-09-21 MED ORDER — ZOLPIDEM TARTRATE 10 MG PO TABS: 10.0000 mg | ORAL_TABLET | Freq: Every evening | ORAL | 5 refills | Status: DC | PRN

## 2018-09-21 MED ORDER — ALPRAZOLAM 1 MG PO TABS: 1.0000 mg | ORAL_TABLET | Freq: Two times a day (BID) | ORAL | 5 refills | Status: DC | PRN

## 2018-09-21 MED ORDER — QUETIAPINE FUMARATE 300 MG PO TABS: 300.0000 mg | ORAL_TABLET | Freq: Every day | ORAL | 1 refills | Status: DC

## 2018-09-21 NOTE — Progress Notes (Signed)
[facility] Check  Patient ID: Guy Mayer,  MRN: [id number]  PCP: Thurman Coyer, MD  Date of Evaluation: 09/21/2018 Time spent:15 minutes  Chief Complaint:  Chief Complaint    Follow-up      HISTORY/CURRENT STATUS: HPI here for routine med check.  States he is doing well overall.  Work is still stressful but manageable.  He sleeps well with the Ambien, which she does not take every night.  Anxiety is still a problem but the Xanax does help.  If he has a particularly stressful day at work he will need one twice a day.    Patient denies loss of interest in usual activities and is able to enjoy things.  Denies decreased energy or motivation.  Appetite has not changed.  No extreme sadness, tearfulness, or feelings of hopelessness.  Denies any changes in concentration, making decisions or remembering things.  Denies suicidal or homicidal thoughts.  Patient denies increased energy with decreased need for sleep, no increased talkativeness, no racing thoughts, no impulsivity or risky behaviors, no increased spending, no increased libido, no grandiosity.  He had a recent checkup with his PCP.  Everything was okay except 1 of his liver test was elevated.  He is wondering if any of the medications that I prescribed would cause a problem with his liver.  Denies muscle or joint pain, stiffness, or dystonia.  Denies dizziness, syncope, seizures, numbness, tingling, tremor, tics, unsteady gait, slurred speech, confusion.   Individual Medical History/ Review of Systems: Changes? :No   Allergies: Codeine  Current Medications:  Current Outpatient Medications:  .  acetaminophen (TYLENOL) 325 MG tablet, Take 650 mg by mouth every 6 (six) hours as needed., Disp: , Rfl:  .  ALPRAZolam (XANAX) 1 MG tablet, Take 1 tablet (1 mg total) by mouth 2 (two) times daily as needed for anxiety., Disp: 60 tablet, Rfl: 5 .  dexlansoprazole (DEXILANT) 60 MG capsule, Take 60 mg by mouth daily., Disp:  , Rfl:  .  QUEtiapine (SEROQUEL) 300 MG tablet, Take 1 tablet (300 mg total) by mouth at bedtime., Disp: 90 tablet, Rfl: 1 .  zolpidem (AMBIEN) 10 MG tablet, Take 1 tablet (10 mg total) by mouth at bedtime as needed for up to 30 days for sleep., Disp: 30 tablet, Rfl: 5 .  fexofenadine (ALLEGRA) 180 MG tablet, Take 180 mg by mouth daily., Disp: , Rfl:  .  Lidocaine-Hydrocortisone Ace (ANAMANTLE HC) 3-2.5 % KIT, Apply 1 application topically 4 (four) times daily as needed (for anal pain / irritation). (Patient not taking: Reported on 09/21/2018), Disp: 1 each, Rfl: 2 .  oxyCODONE (OXY IR/ROXICODONE) 5 MG immediate release tablet, Take 1-2 tablets (5-10 mg total) by mouth every 4 (four) hours as needed. (Patient not taking: Reported on 09/21/2018), Disp: 30 tablet, Rfl: 0 .  pseudoephedrine (SUDAFED) 120 MG 12 hr tablet, Take 120 mg by mouth daily., Disp: , Rfl:  Medication Side Effects: none  Family Medical/ Social History: Changes? No  MENTAL HEALTH EXAM:  There were no vitals taken for this visit.There is no height or weight on file to calculate BMI.  General Appearance: Casual and Well Groomed  Eye Contact:  Good  Speech:  Clear and Coherent  Volume:  Normal  Mood:  Euthymic  Affect:  Appropriate  Thought Process:  Goal Directed  Orientation:  Full (Time, Place, and Person)  Thought Content: Logical   Suicidal Thoughts:  No  Homicidal Thoughts:  No  Memory:  WNL  Judgement:  Good  Insight:  Good  Psychomotor Activity:  Normal  Concentration:  Concentration: Good  Recall:  Good  Fund of Knowledge: Good  Language: Good  Assets:  Desire for Improvement  ADL's:  Intact  Cognition: WNL  Prognosis:  Good  08/28/2018 glucose was 101. LFTs were normal except AST was 95.  Lipid profile was not done.  See others on chart.  DIAGNOSES:    ICD-10-CM   1. Bipolar I disorder (HCC) F31.9   2. Generalized anxiety disorder F41.1   3. Insomnia, unspecified type G47.00     Receiving  Psychotherapy: No    RECOMMENDATIONS:  As far as LFTs go, he needs to decrease his alcohol intake and follow all recommendations per his PCP. Continue Seroquel 300 mg nightly. Continue Xanax 1 mg twice daily PRN.  He knows not to take this if he has had any alcohol or plans to that day. Continue Ambien 10 mg nightly as needed sleep. I have asked him to get a lipid profile done through his PCP.  He states he has an upcoming complete physical and will have those results sent to me. Return in 6 months or sooner as needed.   , PA-C   This record has been created using Dragon software.  Chart creation errors have been sought, but may not always have been located and corrected. Such creation errors do not reflect on the standard of medical care.  

## 2018-11-21 ENCOUNTER — Other Ambulatory Visit: Payer: Self-pay | Admitting: Physician Assistant

## 2018-11-21 ENCOUNTER — Telehealth: Payer: Self-pay | Admitting: Physician Assistant

## 2018-11-21 MED ORDER — ALPRAZOLAM 1 MG PO TABS: 1.0000 mg | ORAL_TABLET | Freq: Three times a day (TID) | ORAL | 0 refills | Status: DC | PRN

## 2018-11-21 NOTE — Telephone Encounter (Signed)
Patient need refill on Alprazolam to be sent to [facility] in [place].  Not time for script to be filled patient has problems and need help

## 2018-11-21 NOTE — Telephone Encounter (Signed)
Last fill #60 on 05/07, will call pt to check on him

## 2018-11-21 NOTE — Telephone Encounter (Signed)
I sent in an Rx for #40.  Call him and let him know this is the only time I will fill this early.  He can only take up to 1 po 3 times a day for now but when he gets his routine prescription filled, he can take more than 2 a day.  Reminded him he is putting himself at life threatening seizures by increasing the dose and then having to drop quickly.  If he overtakes Xanax again, I will not fill early.

## 2018-11-22 NOTE — Telephone Encounter (Signed)
Pt notified and verbalized understanding.

## 2019-03-29 ENCOUNTER — Ambulatory Visit: Payer: BLUE CROSS/BLUE SHIELD | Admitting: Physician Assistant

## 2019-04-15 ENCOUNTER — Other Ambulatory Visit: Payer: Self-pay | Admitting: Physician Assistant

## 2019-04-23 ENCOUNTER — Ambulatory Visit: Payer: BLUE CROSS/BLUE SHIELD | Admitting: Physician Assistant

## 2019-05-13 ENCOUNTER — Other Ambulatory Visit: Payer: Self-pay | Admitting: Physician Assistant

## 2019-05-16 ENCOUNTER — Ambulatory Visit: Payer: BLUE CROSS/BLUE SHIELD | Admitting: Physician Assistant

## 2019-06-13 ENCOUNTER — Ambulatory Visit: Payer: BLUE CROSS/BLUE SHIELD | Admitting: Physician Assistant

## 2019-06-14 ENCOUNTER — Other Ambulatory Visit: Payer: Self-pay | Admitting: Physician Assistant

## 2019-06-17 ENCOUNTER — Encounter: Payer: Self-pay | Admitting: Physician Assistant

## 2019-06-17 ENCOUNTER — Other Ambulatory Visit: Payer: Self-pay

## 2019-06-17 ENCOUNTER — Ambulatory Visit (INDEPENDENT_AMBULATORY_CARE_PROVIDER_SITE_OTHER): Payer: BLUE CROSS/BLUE SHIELD | Admitting: Physician Assistant

## 2019-06-17 DIAGNOSIS — G4700 Insomnia, unspecified: Secondary | ICD-10-CM | POA: Diagnosis not present

## 2019-06-17 DIAGNOSIS — F411 Generalized anxiety disorder: Secondary | ICD-10-CM | POA: Diagnosis not present

## 2019-06-17 DIAGNOSIS — F514 Sleep terrors [night terrors]: Secondary | ICD-10-CM | POA: Diagnosis not present

## 2019-06-17 DIAGNOSIS — F319 Bipolar disorder, unspecified: Secondary | ICD-10-CM | POA: Diagnosis not present

## 2019-06-17 DIAGNOSIS — Z87891 Personal history of nicotine dependence: Secondary | ICD-10-CM

## 2019-06-17 MED ORDER — ALPRAZOLAM 1 MG PO TABS: ORAL_TABLET | ORAL | 1 refills | Status: DC

## 2019-06-17 MED ORDER — QUETIAPINE FUMARATE 400 MG PO TABS: 400.0000 mg | ORAL_TABLET | Freq: Every day | ORAL | 1 refills | Status: DC

## 2019-06-17 NOTE — Progress Notes (Signed)
[facility]  Patient ID: Guy Mayer,  MRN: [id number]  PCP: Thurman Coyer, MD  Date of Evaluation: 06/17/2019 Time spent:25 minutes  Chief Complaint:  Chief Complaint    Anxiety; Depression; Follow-up     Virtual Visit via Telephone Note  I connected with patient by a video enabled telemedicine application or telephone, with their informed consent, and verified patient privacy and that I am speaking with the correct person using two identifiers.  I am private, in my office and the patient is at work.  I discussed the limitations, risks, security and privacy concerns of performing an evaluation and management service by telephone and the availability of in person appointments. I also discussed with the patient that there may be a patient responsible charge related to this service. The patient expressed understanding and agreed to proceed.   I discussed the assessment and treatment plan with the patient. The patient was provided an opportunity to ask questions and all were answered. The patient agreed with the plan and demonstrated an understanding of the instructions.   The patient was advised to call back or seek an in-person evaluation if the symptoms worsen or if the condition fails to improve as anticipated.  I provided 25 minutes of non-face-to-face time during this encounter.   HISTORY/CURRENT STATUS: HPI for routine med check.  Patient states that he has been a lot more anxious, especially since he quit smoking 3 to 4 months ago.  Also he lost his dog which has been traumatic.  "It was my baby. I'm having a lot of bad dreams since I quit smoking too.  I don't need the Ambien every night to go to sleep, so I don't take it, but then I'll wake up often, having the same dream."   Thinks he's stable as far as mood goes.  "My wife might say something different."  He doesn't specify what he means by that.  He is able to enjoy things.  Energy and motivation are  good.  He is working without missing days.  Denies suicidal or homicidal thoughts.  Patient denies increased energy with decreased need for sleep, no increased talkativeness, no racing thoughts, no impulsivity or risky behaviors, no increased spending, no increased libido, no grandiosity.  Denies hallucinations.  Denies dizziness, syncope, seizures, numbness, tingling, tremor, tics, unsteady gait, slurred speech, confusion. Denies muscle or joint pain, stiffness, or dystonia.  Individual Medical History/ Review of Systems: Changes? :No    Past medications for mental health diagnoses include: Elavil, Xanax, Celexa, Seroquel, Wellbutrin, Ambien, Depakote, Lamictal  Allergies: Codeine  Current Medications:  Current Outpatient Medications:  .  acetaminophen (TYLENOL) 325 MG tablet, Take 650 mg by mouth every 6 (six) hours as needed., Disp: , Rfl:  .  dexlansoprazole (DEXILANT) 60 MG capsule, Take 60 mg by mouth daily., Disp: , Rfl:  .  fexofenadine (ALLEGRA) 180 MG tablet, Take 180 mg by mouth daily., Disp: , Rfl:  .  zolpidem (AMBIEN) 10 MG tablet, TAKE 1 TABLET BY MOUTH HS AS NEEDED FOR SLEEP, Disp: 30 tablet, Rfl: 0 .  ALPRAZolam (XANAX) 1 MG tablet, 1 po bid prn and may take 1/2 po mid-afternoon prn, Disp: 75 tablet, Rfl: 1 .  Lidocaine-Hydrocortisone Ace (ANAMANTLE HC) 3-2.5 % KIT, Apply 1 application topically 4 (four) times daily as needed (for anal pain / irritation). (Patient not taking: Reported on 09/21/2018), Disp: 1 each, Rfl: 2 .  oxyCODONE (OXY IR/ROXICODONE) 5 MG immediate release tablet, Take 1-2 tablets (5-10  mg total) by mouth every 4 (four) hours as needed. (Patient not taking: Reported on 09/21/2018), Disp: 30 tablet, Rfl: 0 .  pseudoephedrine (SUDAFED) 120 MG 12 hr tablet, Take 120 mg by mouth daily., Disp: , Rfl:  .  QUEtiapine (SEROQUEL) 400 MG tablet, Take 1 tablet (400 mg total) by mouth at bedtime., Disp: 30 tablet, Rfl: 1 Medication Side Effects: none  Family Medical/  Social History: Changes? No  MENTAL HEALTH EXAM:  There were no vitals taken for this visit.There is no height or weight on file to calculate BMI.  General Appearance: Unable to assess  Eye Contact:  Unable to assess  Speech:  Clear and Coherent  Volume:  Normal  Mood:  Euthymic  Affect:  Unable to assess  Thought Process:  Goal Directed  Orientation:  Full (Time, Place, and Person)  Thought Content: Logical   Suicidal Thoughts:  No  Homicidal Thoughts:  No  Memory:  WNL  Judgement:  Good  Insight:  Good  Psychomotor Activity:  Unable to assess  Concentration:  Concentration: Good  Recall:  Good  Fund of Knowledge: Good  Language: Good  Assets:  Desire for Improvement  ADL's:  Intact  Cognition: WNL  Prognosis:  Good    DIAGNOSES:    ICD-10-CM   1. Bipolar I disorder ([place])  F31.9   2. Generalized anxiety disorder  F41.1   3. Insomnia, unspecified type  G47.00   4. Night terrors  F51.4   5. Smoker within last 12 months  Z87.891     Receiving Psychotherapy: No    RECOMMENDATIONS:  I spent 25 minutes with him on the phone, discussing the new problem of night terrors, diagnosis of smoking cessation, along with former diagnoses and worsening of anxiety. Increase Seroquel to 400 mg nightly.  The goal is to help sleep without the night terrors.  I may need to add prazosin or doxazosin if this does not help.  I prefer to see him in the office in order to check blood pressure before that is added. Continue Ambien 10 mg, 1/2-1 nightly as needed sleep. Increased Xanax 1 mg to 1 p.o. twice daily as needed and now one half p.o. midday as needed. Congratulated and encouraged to continue abstinence from tobacco. Consider counseling. Return in 6 weeks.  Donnal Moat, PA-C

## 2019-07-16 ENCOUNTER — Other Ambulatory Visit: Payer: Self-pay | Admitting: Physician Assistant

## 2019-08-14 ENCOUNTER — Ambulatory Visit: Payer: BLUE CROSS/BLUE SHIELD | Admitting: Physician Assistant

## 2019-08-15 ENCOUNTER — Encounter: Payer: Self-pay | Admitting: Physician Assistant

## 2019-08-15 ENCOUNTER — Ambulatory Visit (INDEPENDENT_AMBULATORY_CARE_PROVIDER_SITE_OTHER): Payer: BLUE CROSS/BLUE SHIELD | Admitting: Physician Assistant

## 2019-08-15 ENCOUNTER — Other Ambulatory Visit: Payer: Self-pay

## 2019-08-15 DIAGNOSIS — F1020 Alcohol dependence, uncomplicated: Secondary | ICD-10-CM

## 2019-08-15 DIAGNOSIS — G4700 Insomnia, unspecified: Secondary | ICD-10-CM | POA: Diagnosis not present

## 2019-08-15 DIAGNOSIS — F514 Sleep terrors [night terrors]: Secondary | ICD-10-CM

## 2019-08-15 DIAGNOSIS — Z87891 Personal history of nicotine dependence: Secondary | ICD-10-CM

## 2019-08-15 DIAGNOSIS — F411 Generalized anxiety disorder: Secondary | ICD-10-CM

## 2019-08-15 DIAGNOSIS — F319 Bipolar disorder, unspecified: Secondary | ICD-10-CM

## 2019-08-15 MED ORDER — ALPRAZOLAM 1 MG PO TABS: ORAL_TABLET | ORAL | 0 refills | Status: DC

## 2019-08-15 MED ORDER — BUSPIRONE HCL 15 MG PO TABS: ORAL_TABLET | ORAL | 1 refills | Status: DC

## 2019-08-15 MED ORDER — QUETIAPINE FUMARATE 300 MG PO TABS: 600.0000 mg | ORAL_TABLET | Freq: Every day | ORAL | 0 refills | Status: DC

## 2019-08-15 NOTE — Patient Instructions (Signed)
On the alprazolam 1 mg (Xanax) take 1 pill twice a day for 1 week then take 1 pill in the morning and half a pill at night for 1 week, then take 1 pill daily for 1 week, then 1/2 daily for 1 week and then stop.

## 2019-08-15 NOTE — Progress Notes (Signed)
[facility]  Patient ID: Guy Mayer,  MRN: [id number]  PCP: Guy Coyer, MD  Date of Evaluation: 08/15/2019 Time spent:50 minutes  Chief Complaint:  Chief Complaint    Depression; Anxiety; Insomnia      HISTORY/CURRENT STATUS: HPI not doing well.  He is accompanied by his wife.  Patient presents with complaints of depression, crying easily for no reason, and feeling like there is no reason to live.  "I am not going to hurt myself if I wanted to I would have done it already."  He reports these symptoms got worse in the past 2 to 3 months.  He found some old amitriptyline 25 mg from a couple of years ago that he started taking a few months ago due to not being able to sleep well.  Around that same time, he also started having nightmares.  He is not sure if it was related to the amitriptyline.  His wife thinks it may be related to the Ambien.  He has since cut that in half, taking 5 mg now, and the nightmares are not as bad.  He is able to work, but he does not have energy or motivation to do anything else.  He had been working in [place] and staying in a hotel from Monday through Thursday and then coming home.  He is working again locally and when he gets off work, all he wants to do is watch TV or go to bed.  He does not enjoy anything.  When he gets home from work, he starts drinking around 5 or 6:00.  He and wife report that he drinks around 8 ounces of hard liquor daily.  Unable to tell me how long this is been going on.  States that his wife does not want to have anything to do with him and he does not have anything better to do.  States he does not have anything to live for, and that if "she will talk to me, what is life worth living for?"  He does have access to a gun but states he will not use it to hurt himself or anyone else.  Patient denies increased energy with decreased need for sleep, no increased talkativeness, no racing thoughts, no impulsivity or risky  behaviors, no increased spending, no increased libido, no grandiosity.  Review of Systems  Constitutional: Negative.   HENT: Negative.   Eyes: Negative.   Respiratory: Negative.   Cardiovascular: Negative.   Gastrointestinal: Negative.   Genitourinary: Negative.   Musculoskeletal: Negative.   Skin: Negative.   Neurological: Negative.   Endo/Heme/Allergies: Negative.   Psychiatric/Behavioral: Positive for depression, substance abuse and suicidal ideas. Negative for hallucinations and memory loss. The patient is nervous/anxious and has insomnia.    Individual Medical History/ Review of Systems: Changes? :No    Past medications for mental health diagnoses include: Elavil, Xanax, Celexa, Seroquel, Wellbutrin, Ambien, Depakote, Lamictal  Allergies: Meloxicam and Codeine  Current Medications:  Current Outpatient Medications:  .  acetaminophen (TYLENOL) 325 MG tablet, Take 650 mg by mouth every 6 (six) hours as needed., Disp: , Rfl:  .  ALPRAZolam (XANAX) 1 MG tablet, 1 po bid prn and may take 1/2 po mid-afternoon prn, Disp: 75 tablet, Rfl: 1 .  dexlansoprazole (DEXILANT) 60 MG capsule, Take 60 mg by mouth daily., Disp: , Rfl:  .  fexofenadine (ALLEGRA) 180 MG tablet, Take 180 mg by mouth daily., Disp: , Rfl:  .  ALPRAZolam (XANAX) 1 MG tablet, Wean off  as directed on written note given to pt., Disp: 10 tablet, Rfl: 0 .  busPIRone (BUSPAR) 15 MG tablet, 1/3 po bid for 7 days, then 2/3 bid for 7 days, then 1 po bid., Disp: 60 tablet, Rfl: 1 .  Lidocaine-Hydrocortisone Ace (ANAMANTLE HC) 3-2.5 % KIT, Apply 1 application topically 4 (four) times daily as needed (for anal pain / irritation). (Patient not taking: Reported on 09/21/2018), Disp: 1 each, Rfl: 2 .  oxyCODONE (OXY IR/ROXICODONE) 5 MG immediate release tablet, Take 1-2 tablets (5-10 mg total) by mouth every 4 (four) hours as needed. (Patient not taking: Reported on 09/21/2018), Disp: 30 tablet, Rfl: 0 .  pseudoephedrine (SUDAFED) 120 MG  12 hr tablet, Take 120 mg by mouth daily., Disp: , Rfl:  .  QUEtiapine (SEROQUEL) 300 MG tablet, Take 2 tablets (600 mg total) by mouth at bedtime., Disp: 60 tablet, Rfl: 0 Medication Side Effects: hypersomnolence and vivid dreams  Family Medical/ Social History: Changes? No  MENTAL HEALTH EXAM:  There were no vitals taken for this visit.There is no height or weight on file to calculate BMI.  General Appearance: Disheveled and Wearing a stained T-shirt which is not like him at all.  He looks very tired with circles under his eyes.  He appears to have lost weight.  Eye Contact:  Good  Speech:  Clear and Coherent and Normal Rate  Volume:  Normal  Mood:  Angry, Depressed, Hopeless, Irritable and Worthless  Affect:  Depressed, Flat, Tearful and Anxious  Thought Process:  Goal Directed and Descriptions of Associations: Intact  Orientation:  Full (Time, Place, and Person)  Thought Content: He will not answer a yes or no question, when asked specific questions that should be easy to answer, he states "what ever you all want to do."  (Referencing his wife and.)   Suicidal Thoughts:  Yes.  without intent/plan  Homicidal Thoughts:  No  Memory:  WNL  Judgement:  Fair  Insight:  Fair  Psychomotor Activity:  Normal  Concentration:  Concentration: Good  Recall:  Good  Fund of Knowledge: Good  Language: Good  Assets:  Financial Resources/Insurance Housing Social Support Transportation  ADL's:  Intact  Cognition: WNL  Prognosis:  Fair    DIAGNOSES:    ICD-10-CM   1. Uncomplicated alcohol dependence ([place])  F10.20   2. Bipolar I disorder ([place])  F31.9   3. Insomnia, unspecified type  G47.00   4. Generalized anxiety disorder  F41.1   5. Smoker within last 12 months  Z87.891   6. Night terrors  F51.4     Receiving Psychotherapy: No    RECOMMENDATIONS:  I spent 50 minutes with him and his wife. PDMP was reviewed. Patient is unwilling to be admitted for detox and suicidal ideations.  He  refuses to quit drinking, shrugging his shoulders and saying "what is the point."  He does commit to safety, and his wife states she will be with him, remove his access to the firearms.  If the suicidal thoughts become active, he will have to be admitted, whether voluntarily or an IVC. Because of the new information of the extent of his alcohol intake, he will have to get off of the Xanax.  He still had quite a few Xanax in his current bottle but I will give him enough to finish off the weaning process. Wean off Xanax 1 mg, take 1 p.o. twice daily for 1 week, then 1 every morning and 0.5 nightly for 1 week, then one  half p.o. twice daily for 1 week, then one half daily for 1 week and then stop. Start BuSpar 15 mg 1/3 tablet twice daily for 1 week, then increase to 2/3 tablet twice daily for 1 week, then increase to 1 tablet twice daily for anxiety.  I discussed the benefits, risks, side effects with him and his wife and he accepts. Increase Seroquel 300 mg to 2 p.o. nightly. Discontinue Ambien. Discontinue amitriptyline. I strongly recommend marital counseling.  His wife brought it up and she is willing to go.  He shrugs his shoulders and said "what ever you want to do."  Then he turned around and said he thought it was a waste of money and it was not going to change anything anyway. Go to [facility], ER if he gets worse in any way. Return in 2 weeks.  Donnal Moat, PA-C

## 2019-08-29 ENCOUNTER — Ambulatory Visit (INDEPENDENT_AMBULATORY_CARE_PROVIDER_SITE_OTHER): Payer: BLUE CROSS/BLUE SHIELD | Admitting: Physician Assistant

## 2019-08-29 ENCOUNTER — Other Ambulatory Visit: Payer: Self-pay

## 2019-08-29 ENCOUNTER — Encounter: Payer: Self-pay | Admitting: Physician Assistant

## 2019-08-29 DIAGNOSIS — F1021 Alcohol dependence, in remission: Secondary | ICD-10-CM

## 2019-08-29 DIAGNOSIS — G4700 Insomnia, unspecified: Secondary | ICD-10-CM

## 2019-08-29 DIAGNOSIS — F411 Generalized anxiety disorder: Secondary | ICD-10-CM

## 2019-08-29 MED ORDER — BUSPIRONE HCL 15 MG PO TABS: 15.0000 mg | ORAL_TABLET | Freq: Three times a day (TID) | ORAL | 1 refills | Status: DC

## 2019-08-29 MED ORDER — ZOLPIDEM TARTRATE 10 MG PO TABS: 10.0000 mg | ORAL_TABLET | Freq: Every evening | ORAL | 0 refills | Status: DC | PRN

## 2019-08-29 MED ORDER — NALTREXONE HCL 50 MG PO TABS: 50.0000 mg | ORAL_TABLET | Freq: Every morning | ORAL | 1 refills | Status: DC

## 2019-08-29 MED ORDER — QUETIAPINE FUMARATE 300 MG PO TABS: 600.0000 mg | ORAL_TABLET | Freq: Every day | ORAL | 1 refills | Status: DC

## 2019-08-29 NOTE — Progress Notes (Signed)
[facility]  Patient ID: Guy Mayer,  MRN: [id number]  PCP: Thurman Coyer, MD  Date of Evaluation: 08/29/2019 Time spent:20 minutes  Chief Complaint:  Chief Complaint    Alcohol Problem; Follow-up      HISTORY/CURRENT STATUS: HPI for 2-week med check.  Accompanied by wife.  Patient states he has not had any alcohol since 08/14/2019.  He has now weaned completely off of the Xanax and is still having anxiety.  The BuSpar is helping a little, it does not work quickly like the Xanax did.  But he thinks it is helping at least some.  He tried to go without the Ambien but after a week or so with not having good sleep, he took the Ambien for the past few nights and has slept better.    He is not having awful dreams like he was before.  He has vivid dreams but they are not nightmares like they were recently.  His mood is stable. Energy is low at times, 'just like everybody has' but he's able to get up and do his job.  No SI/HI.  Patient denies increased energy with decreased need for sleep, no increased talkativeness, no racing thoughts, no impulsivity or risky behaviors, no increased spending, no increased libido, no grandiosity.  Denies dizziness, syncope, seizures, numbness, tingling, tremor, tics, unsteady gait, slurred speech, confusion. Denies muscle or joint pain, stiffness, or dystonia.  Individual Medical History/ Review of Systems: Changes? :No    Past medications for mental health diagnoses include: Elavil, Xanax, Celexa, Seroquel, Wellbutrin, Ambien, Depakote, Lamictal  Allergies: Meloxicam and Codeine  Current Medications:  Current Outpatient Medications:  .  busPIRone (BUSPAR) 15 MG tablet, Take 1 tablet (15 mg total) by mouth 3 (three) times daily., Disp: 90 tablet, Rfl: 1 .  dexlansoprazole (DEXILANT) 60 MG capsule, Take 60 mg by mouth daily., Disp: , Rfl:  .  QUEtiapine (SEROQUEL) 300 MG tablet, Take 2 tablets (600 mg total) by mouth at bedtime.,  Disp: 60 tablet, Rfl: 1 .  acetaminophen (TYLENOL) 325 MG tablet, Take 650 mg by mouth every 6 (six) hours as needed., Disp: , Rfl:  .  fexofenadine (ALLEGRA) 180 MG tablet, Take 180 mg by mouth daily., Disp: , Rfl:  .  Lidocaine-Hydrocortisone Ace (ANAMANTLE HC) 3-2.5 % KIT, Apply 1 application topically 4 (four) times daily as needed (for anal pain / irritation). (Patient not taking: Reported on 09/21/2018), Disp: 1 each, Rfl: 2 .  naltrexone (DEPADE) 50 MG tablet, Take 1 tablet (50 mg total) by mouth in the morning., Disp: 30 tablet, Rfl: 1 .  oxyCODONE (OXY IR/ROXICODONE) 5 MG immediate release tablet, Take 1-2 tablets (5-10 mg total) by mouth every 4 (four) hours as needed. (Patient not taking: Reported on 09/21/2018), Disp: 30 tablet, Rfl: 0 .  pseudoephedrine (SUDAFED) 120 MG 12 hr tablet, Take 120 mg by mouth daily., Disp: , Rfl:  .  zolpidem (AMBIEN) 10 MG tablet, Take 1 tablet (10 mg total) by mouth at bedtime as needed for sleep., Disp: 30 tablet, Rfl: 0 Medication Side Effects: none  Family Medical/ Social History: Changes? Yes has had no alcohol in 15 days.  MENTAL HEALTH EXAM:  There were no vitals taken for this visit.There is no height or weight on file to calculate BMI.  General Appearance: Casual, Neat and Well Groomed  Eye Contact:  Good  Speech:  Clear and Coherent  Volume:  Normal  Mood:  Euthymic  Affect:  Appropriate  Thought Process:  Goal Directed and Descriptions of Associations: Intact  Orientation:  Full (Time, Place, and Person)  Thought Content: Logical   Suicidal Thoughts:  No  Homicidal Thoughts:  No  Memory:  WNL  Judgement:  Good  Insight:  Good  Psychomotor Activity:  Normal  Concentration:  Concentration: Good  Recall:  Good  Fund of Knowledge: Good  Language: Good  Assets:  Desire for Improvement  ADL's:  Intact  Cognition: WNL  Prognosis:  Good    DIAGNOSES:    ICD-10-CM   1. Alcohol use disorder, moderate, in early remission ([place])  F10.21    2. Generalized anxiety disorder  F41.1   3. Insomnia, unspecified type  G47.00     Receiving Psychotherapy: No    RECOMMENDATIONS:  I spent 20 minutes with him and his wife. PDMP was reviewed. I am happy for him and his wife that he has stopped drinking.  I do recommend AA.  He declines. Start naltrexone 50 mg 1 p.o. every morning. Increase BuSpar 15 mg up to 3 times daily. Continue Seroquel 300 mg, 2 p.o. nightly. Continue Ambien 10 mg nightly as needed, for now.  Sleep is very important in the healing process and I trust him not to abuse this.  Sleep hygiene. Recommend counseling. Return in 4 weeks.  Donnal Moat, PA-C

## 2019-09-27 ENCOUNTER — Ambulatory Visit: Payer: BLUE CROSS/BLUE SHIELD | Admitting: Physician Assistant

## 2019-09-29 ENCOUNTER — Other Ambulatory Visit: Payer: Self-pay | Admitting: Physician Assistant

## 2019-10-02 ENCOUNTER — Ambulatory Visit: Payer: BLUE CROSS/BLUE SHIELD | Admitting: Physician Assistant

## 2019-12-27 ENCOUNTER — Other Ambulatory Visit: Payer: Self-pay | Admitting: Physician Assistant

## 2019-12-27 NOTE — Telephone Encounter (Signed)
Last apt 08/2019 was due back at 4 weeks

## 2020-01-22 ENCOUNTER — Other Ambulatory Visit: Payer: Self-pay | Admitting: Physician Assistant

## 2020-02-05 ENCOUNTER — Other Ambulatory Visit: Payer: Self-pay | Admitting: Physician Assistant

## 2020-02-10 ENCOUNTER — Ambulatory Visit (INDEPENDENT_AMBULATORY_CARE_PROVIDER_SITE_OTHER): Payer: BLUE CROSS/BLUE SHIELD | Admitting: Physician Assistant

## 2020-02-10 ENCOUNTER — Other Ambulatory Visit: Payer: Self-pay

## 2020-02-10 ENCOUNTER — Encounter: Payer: Self-pay | Admitting: Physician Assistant

## 2020-02-10 DIAGNOSIS — F411 Generalized anxiety disorder: Secondary | ICD-10-CM

## 2020-02-10 DIAGNOSIS — G4700 Insomnia, unspecified: Secondary | ICD-10-CM

## 2020-02-10 DIAGNOSIS — F1010 Alcohol abuse, uncomplicated: Secondary | ICD-10-CM | POA: Diagnosis not present

## 2020-02-10 MED ORDER — QUETIAPINE FUMARATE 400 MG PO TABS: 400.0000 mg | ORAL_TABLET | Freq: Every day | ORAL | 1 refills | Status: DC

## 2020-02-10 MED ORDER — THIAMINE HCL 100 MG PO TABS: 100.0000 mg | ORAL_TABLET | Freq: Every day | ORAL | 1 refills | Status: AC

## 2020-02-10 MED ORDER — BUSPIRONE HCL 30 MG PO TABS: 30.0000 mg | ORAL_TABLET | Freq: Two times a day (BID) | ORAL | 1 refills | Status: DC

## 2020-02-10 MED ORDER — CHLORDIAZEPOXIDE HCL 25 MG PO CAPS: ORAL_CAPSULE | ORAL | 0 refills | Status: DC

## 2020-02-10 NOTE — Progress Notes (Signed)
[facility]  Patient ID: Guy Mayer,  MRN: [id number]  PCP: Lavell Islam, MD  Date of Evaluation: 02/10/2020 Time spent:40 minutes  Chief Complaint:  Chief Complaint    Alcohol Problem; Depression      HISTORY/CURRENT STATUS: HPI Not doing well.  Accompanied by wife.  Has been drinking again for 4-5 weeks.  Last drink was yesterday.  States he quits for a while and then starts back, quits for a while and then starts back.  Most recently, he is been drinking about 5 shots of liquor every evening.  One of the triggers for increased alcohol is that one of his dogs died.  His wife states it always hits him very hard when they have a pet to pass away.  He is depressed and feels hopeless.  Energy and motivation are both low.  He has been out of work due to recovery from a herniated disc repair in June.  That has also decreased his energy.  He will be going back to work next week.  He and his wife state that even though he has officially been out of work, he has still been working from home.  Isolating.  Denies suicidal or homicidal thoughts.  Continues to have anxiety.  Says the BuSpar is not helping at all.  He is not sleeping well.  The Seroquel does not help like it used to with sleep.  Patient denies increased energy with decreased need for sleep, no increased talkativeness, no racing thoughts, no impulsivity or risky behaviors, no increased spending, no increased libido, no paranoia or no grandiosity.  Denies dizziness, syncope, seizures, numbness, tingling, tremor, tics, unsteady gait, slurred speech, confusion. Denies muscle or joint pain, stiffness, or dystonia.  Individual Medical History/ Review of Systems: Changes? :Yes   Herniated disc repair in June 2021.  Past medications for mental health diagnoses include: Elavil, Xanax, Celexa, Seroquel, Wellbutrin, Ambien, Depakote, Lamictal  Allergies: Meloxicam and Codeine  Current Medications:  Current Outpatient  Medications:  .  acetaminophen (TYLENOL) 325 MG tablet, Take 650 mg by mouth every 6 (six) hours as needed., Disp: , Rfl:  .  dexlansoprazole (DEXILANT) 60 MG capsule, Take 60 mg by mouth daily., Disp: , Rfl:  .  losartan-hydrochlorothiazide (HYZAAR) 100-12.5 MG tablet, Take 1 tablet by mouth daily., Disp: , Rfl:  .  busPIRone (BUSPAR) 30 MG tablet, Take 1 tablet (30 mg total) by mouth 2 (two) times daily., Disp: 60 tablet, Rfl: 1 .  chlordiazePOXIDE (LIBRIUM) 25 MG capsule, 2 po qid for 2 days, then 2 po tid for 2 days, then 1 po qid for 2 days, then 1 po tid for 2 days, then 1 bid for days, then stop, Disp: 46 capsule, Rfl: 0 .  fexofenadine (ALLEGRA) 180 MG tablet, Take 180 mg by mouth daily. (Patient not taking: Reported on 02/10/2020), Disp: , Rfl:  .  Lidocaine-Hydrocortisone Ace (ANAMANTLE HC) 3-2.5 % KIT, Apply 1 application topically 4 (four) times daily as needed (for anal pain / irritation). (Patient not taking: Reported on 09/21/2018), Disp: 1 each, Rfl: 2 .  naltrexone (DEPADE) 50 MG tablet, Take 1 tablet (50 mg total) by mouth in the morning. (Patient not taking: Reported on 02/10/2020), Disp: 30 tablet, Rfl: 1 .  oxyCODONE (OXY IR/ROXICODONE) 5 MG immediate release tablet, Take 1-2 tablets (5-10 mg total) by mouth every 4 (four) hours as needed. (Patient not taking: Reported on 09/21/2018), Disp: 30 tablet, Rfl: 0 .  pseudoephedrine (SUDAFED) 120 MG 12 hr  tablet, Take 120 mg by mouth daily. (Patient not taking: Reported on 02/10/2020), Disp: , Rfl:  .  QUEtiapine (SEROQUEL) 400 MG tablet, Take 1 tablet (400 mg total) by mouth at bedtime., Disp: 60 tablet, Rfl: 1 .  thiamine 100 MG tablet, Take 1 tablet (100 mg total) by mouth daily., Disp: 30 tablet, Rfl: 1 Medication Side Effects: none  Family Medical/ Social History: Changes?  See HPI.  MENTAL HEALTH EXAM:  There were no vitals taken for this visit.There is no height or weight on file to calculate BMI.  General Appearance: Casual, Neat  and Well Groomed  Eye Contact:  Good  Speech:  Clear and Coherent  Volume:  Normal  Mood:  Euthymic  Affect:  Appropriate  Thought Process:  Goal Directed and Descriptions of Associations: Intact  Orientation:  Full (Time, Place, and Person)  Thought Content: Logical   Suicidal Thoughts:  No  Homicidal Thoughts:  No  Memory:  WNL  Judgement:  Good  Insight:  Good  Psychomotor Activity:  Normal  Concentration:  Concentration: Good  Recall:  Good  Fund of Knowledge: Good  Language: Good  Assets:  Desire for Improvement  ADL's:  Intact  Cognition: WNL  Prognosis:  Good    DIAGNOSES:    ICD-10-CM   1. Alcohol abuse  F10.10   2. Generalized anxiety disorder  F41.1   3. Insomnia, unspecified type  G47.00     Receiving Psychotherapy: No    RECOMMENDATIONS:  PDMP was reviewed. I provided 40 minutes of face-to-face time during this encounter with the patient and his wife. We discussed alcohol abuse and prevention of withdrawal seizures.  I recommend Librium to prevent that.  I discussed the benefits, risk and side effects and he and his wife accept. We discussed the naltrexone, and I do not believe he actually took it at the last visit in February.  Reminded him it can help prevent cravings.  May also need to use acamprosate. Increase BuSpar to 30 mg 1 p.o. twice daily. Start naltrexone 50 mg, 1 p.o. every morning. Start Librium as follows, 25 mg, 2 pills 4 times daily for 2 days, then 2 pills 3 times daily for 2 days, then 1 pill 4 times daily for 2 days, then 1 pill 3 times daily for 2 days, then 1 p.o. twice daily for 2 days and then stop.  These instructions were written out on the AVS. Increase Seroquel to 400 mg 2 po qhs.  Start thiamine. Discontinue Ambien. Recommend AA or celebrate recovery. Recommend counseling. Return in 3 months.He prefers not to be followed more closely, even though I strongly recommend. They know to call if he has any complications at all, or go to  ER if worse.   Donnal Moat, PA-C

## 2020-02-10 NOTE — Patient Instructions (Signed)
On the Librium 25 mg take 2 pills 4 times a day for 2 days Then 2 pills 3 times a day for 2 days Then 1 pill 4 times a day for 2 days Then 1 pill 3 times a day for 2 days Then 1 pill twice a day for 2 days and then stop

## 2020-02-14 ENCOUNTER — Encounter: Payer: Self-pay | Admitting: Physician Assistant

## 2020-02-14 DIAGNOSIS — F411 Generalized anxiety disorder: Secondary | ICD-10-CM | POA: Insufficient documentation

## 2020-02-14 DIAGNOSIS — G4700 Insomnia, unspecified: Secondary | ICD-10-CM | POA: Insufficient documentation

## 2020-02-14 DIAGNOSIS — F1010 Alcohol abuse, uncomplicated: Secondary | ICD-10-CM | POA: Insufficient documentation

## 2020-03-04 ENCOUNTER — Other Ambulatory Visit: Payer: Self-pay | Admitting: Physician Assistant

## 2020-03-05 NOTE — Telephone Encounter (Signed)
Please review

## 2020-03-08 ENCOUNTER — Other Ambulatory Visit: Payer: Self-pay | Admitting: Physician Assistant

## 2020-03-10 ENCOUNTER — Telehealth: Payer: Self-pay | Admitting: Physician Assistant

## 2020-03-10 ENCOUNTER — Encounter: Payer: Self-pay | Admitting: Physician Assistant

## 2020-03-10 ENCOUNTER — Other Ambulatory Visit: Payer: Self-pay

## 2020-03-10 MED ORDER — QUETIAPINE FUMARATE 400 MG PO TABS
800.0000 mg | ORAL_TABLET | Freq: Every day | ORAL | 1 refills | Status: DC
Start: 2020-03-10 — End: 2020-03-17

## 2020-03-10 NOTE — Telephone Encounter (Signed)
Updated dose sent to [facility]

## 2020-03-10 NOTE — Telephone Encounter (Signed)
Wife called to report that Guy Mayer's prescription for Seroquel was written incorrectly.  At last appt. It was increased to 2 qHS but the bottle says 1 qHS. They did get #60 but now can't refill because due to the way it is written it is too early to refill.  Please send in a correct prescription to show 2 qHS.  [facility] [place]

## 2020-03-13 ENCOUNTER — Telehealth: Payer: Self-pay | Admitting: Physician Assistant

## 2020-03-13 NOTE — Telephone Encounter (Signed)
Pt called about Seroquel Rx written incorrectly. Advised Pt, nurse sent to [facility] [place] [facility]7 [facility]. Stated needed to talk about increasing dose again.Takes med 7-7:30 PM goes to bed 9-9:30 PM. Takes couple hours before he can even go to sleep then, wakes up 2-3 times nightly. Next apt 11/8. Advised Pt to pick up new Rx for 800 mg nightly and try over weekend and see if any change. Report on Monday the results.

## 2020-03-13 NOTE — Telephone Encounter (Signed)
Noted will wait for report back on Monday

## 2020-03-17 ENCOUNTER — Other Ambulatory Visit: Payer: Self-pay | Admitting: Physician Assistant

## 2020-03-17 MED ORDER — QUETIAPINE FUMARATE 400 MG PO TABS: 1000.0000 mg | ORAL_TABLET | Freq: Every day | ORAL | 1 refills | Status: DC

## 2020-03-17 NOTE — Telephone Encounter (Signed)
Guy Mayer, please let him know to increase the Seroquel 400 mg to 2.5 pills (1000 mg) I sent in a new prescription to [facility] [place] to reflect these changes, but he should use his current supply.

## 2020-03-17 NOTE — Telephone Encounter (Signed)
Pt reported the 800 mg of Seroquel did not help with going to sleep & staying asleep. Don't know if you can increase or if need apt. Advise pt @ [phone].

## 2020-03-17 NOTE — Telephone Encounter (Signed)
Please review his follow up call today

## 2020-03-18 NOTE — Telephone Encounter (Signed)
Pt. Made aware and verbalized understanding.

## 2020-03-19 NOTE — Telephone Encounter (Signed)
Noted  

## 2020-04-01 ENCOUNTER — Telehealth: Payer: Self-pay | Admitting: Physician Assistant

## 2020-04-01 NOTE — Telephone Encounter (Signed)
Pt LM on VM stating he is still having issues with anxiety and waking up in the middle of the night and can't go back to sleep. Asking for a Rx for Valium to see if he can sleep through the night. Next apt 11/8 Contact # [phone]

## 2020-04-01 NOTE — Telephone Encounter (Signed)
No BZ b/c ETOH abuse. Can Rx Trazodone 100 mg #60 1-2 qhs prn sleep.

## 2020-04-01 NOTE — Telephone Encounter (Signed)
Please review

## 2020-04-02 NOTE — Telephone Encounter (Signed)
Guy Mayer, please call with information and get pharmacy if he wants to try

## 2020-04-02 NOTE — Telephone Encounter (Signed)
He would like to try the Trazodone. [facility] [place]

## 2020-04-03 ENCOUNTER — Other Ambulatory Visit: Payer: Self-pay

## 2020-04-03 MED ORDER — TRAZODONE HCL 100 MG PO TABS: ORAL_TABLET | ORAL | 0 refills | Status: DC

## 2020-04-03 NOTE — Telephone Encounter (Signed)
Rx sent 

## 2020-04-15 ENCOUNTER — Telehealth: Payer: Self-pay | Admitting: Physician Assistant

## 2020-04-15 NOTE — Telephone Encounter (Signed)
I sent in Trazodone on 04/03/2020.  Was it effective?

## 2020-04-15 NOTE — Telephone Encounter (Signed)
Last visit 02/10/2020

## 2020-04-15 NOTE — Telephone Encounter (Signed)
Pt called and is having a hard time sleeping. He is wanting to know if he can get something to help him sleep. [facility] [place]. Next appt is 11/08

## 2020-04-16 ENCOUNTER — Other Ambulatory Visit: Payer: Self-pay | Admitting: Physician Assistant

## 2020-04-16 MED ORDER — MIRTAZAPINE 7.5 MG PO TABS: 7.5000 mg | ORAL_TABLET | Freq: Every evening | ORAL | 0 refills | Status: DC | PRN

## 2020-04-16 NOTE — Telephone Encounter (Signed)
Have him stop the trazodone.  I sent in mirtazapine.

## 2020-04-16 NOTE — Telephone Encounter (Signed)
Rtc to patient, yes he reports currently taking the Trazodone 200 mg at hs, it definitely helps him getting to sleep quicker but he's waking up in a couple of hours.

## 2020-04-17 NOTE — Telephone Encounter (Signed)
Patient returned my call and made aware.

## 2020-04-17 NOTE — Telephone Encounter (Signed)
Please let him know

## 2020-04-17 NOTE — Telephone Encounter (Signed)
Left him a vm to return my call.  

## 2020-04-23 ENCOUNTER — Other Ambulatory Visit: Payer: Self-pay | Admitting: Physician Assistant

## 2020-04-24 ENCOUNTER — Telehealth: Payer: Self-pay | Admitting: Physician Assistant

## 2020-04-24 NOTE — Telephone Encounter (Signed)
Left him a vm to return my call.

## 2020-04-24 NOTE — Telephone Encounter (Signed)
Pt left message that the new sleeping med he is on is not working. Pt would like to try something else and get a rx for anxiety.

## 2020-04-24 NOTE — Telephone Encounter (Signed)
Guy Mayer, see if he's getting any sleep with the Remeron and how much he's taking and if he has trouble falling asleep or staying asleep

## 2020-05-08 ENCOUNTER — Other Ambulatory Visit: Payer: Self-pay | Admitting: Physician Assistant

## 2020-05-11 ENCOUNTER — Ambulatory Visit: Payer: BLUE CROSS/BLUE SHIELD | Admitting: Physician Assistant

## 2020-05-11 ENCOUNTER — Other Ambulatory Visit: Payer: Self-pay | Admitting: Physician Assistant

## 2020-05-15 ENCOUNTER — Telehealth: Payer: Self-pay | Admitting: Physician Assistant

## 2020-05-15 NOTE — Telephone Encounter (Signed)
He missed an appointment in the past week or so.  Reminded him this is why we make appointments, to discuss these things. I will prescribe gabapentin 300 mg, 1 p.o. nightly approximately 1 hour before bed.  No refills.  He must be seen before I will give him any more prescriptions.

## 2020-05-15 NOTE — Telephone Encounter (Signed)
Pt left a message that he is not able to sleep. He said that he wakes up two or three times a night. When he does take something to help him sleep he wakes up dizzy. Please call him at [phone]

## 2020-05-18 NOTE — Telephone Encounter (Signed)
Rtc to patient but he's already prescribed Gabapentin 300 mg 2 capsules at hs for his back pain. Informed him I would let Helene Kelp know. He has apt on Wednesday 11/17 and can be discussed further.

## 2020-05-18 NOTE — Telephone Encounter (Signed)
Gabapentin isn't on med list. Will discuss at appt.

## 2020-05-20 ENCOUNTER — Ambulatory Visit (INDEPENDENT_AMBULATORY_CARE_PROVIDER_SITE_OTHER): Payer: 59 | Admitting: Physician Assistant

## 2020-05-20 ENCOUNTER — Other Ambulatory Visit: Payer: Self-pay

## 2020-05-20 ENCOUNTER — Encounter: Payer: Self-pay | Admitting: Physician Assistant

## 2020-05-20 DIAGNOSIS — F1010 Alcohol abuse, uncomplicated: Secondary | ICD-10-CM | POA: Diagnosis not present

## 2020-05-20 DIAGNOSIS — F319 Bipolar disorder, unspecified: Secondary | ICD-10-CM | POA: Diagnosis not present

## 2020-05-20 DIAGNOSIS — G4700 Insomnia, unspecified: Secondary | ICD-10-CM | POA: Diagnosis not present

## 2020-05-20 DIAGNOSIS — F411 Generalized anxiety disorder: Secondary | ICD-10-CM | POA: Diagnosis not present

## 2020-05-20 DIAGNOSIS — F10982 Alcohol use, unspecified with alcohol-induced sleep disorder: Secondary | ICD-10-CM

## 2020-05-20 DIAGNOSIS — Z87891 Personal history of nicotine dependence: Secondary | ICD-10-CM

## 2020-05-20 MED ORDER — GABAPENTIN 300 MG PO CAPS: 900.0000 mg | ORAL_CAPSULE | Freq: Every day | ORAL | 0 refills | Status: DC

## 2020-05-20 MED ORDER — QUETIAPINE FUMARATE 400 MG PO TABS: 1000.0000 mg | ORAL_TABLET | Freq: Every day | ORAL | 1 refills | Status: DC

## 2020-05-20 NOTE — Progress Notes (Signed)
[facility]  Patient ID: Guy Mayer,  MRN: [id number]  PCP: Thurman Coyer, MD  Date of Evaluation: 05/20/2020 Time spent:40 minutes  Chief Complaint:  Chief Complaint    Insomnia; Stress; Anxiety; Depression      HISTORY/CURRENT STATUS: HPI Not sleeping well.   Main complaint now is insomnia. It seems like every medicine for sleep that he's tried will help him go to sleep, but he wakes up every 1-2 hours and has trouble going back to sleep. The only thing that ever helped is Xanax. Not sure if he snores or not. No recent weight gain.   In August, he was treated for ETOH with Librium. He 'saved a couple' and that has helped him sleep a little. He did quit drinking for about 5 weeks or so, he can't remember exactly. Now reports about 12 shots per week.   Lost his job since the [place]. Has been in same company for over 10 years. That's been hard.  He's interviewing and has a good lead, he hopes, soon. Anxiety is still present and is understandably a bit worse d/t loss of job.   Since LOV, Seroquel was increased to 1,$RemoveBefore'000mg'[id number]$  but hasn't helped sleep or mood. Doesn't want to do much but that's not new. Energy and motivation are low 'because I'm tired from not sleeping.' He does feel well enough to put in applications and do a few routine things around the house. Hygiene is nl. Not crying easily. No SI/HI.   Patient denies increased energy with decreased need for sleep, no increased talkativeness, no racing thoughts, no impulsivity or risky behaviors, no increased spending, no increased libido, no paranoia or no grandiosity.  Denies dizziness, syncope, seizures, numbness, tingling, tremor, tics, unsteady gait, slurred speech, confusion. Denies muscle or joint pain, stiffness, or dystonia. Denies unexplained weight loss, frequent infections, or sores that heal slowly.  No polyphagia, polydipsia, or polyuria. Denies visual changes or paresthesias.   Individual Medical History/  Review of Systems: Changes? :Yes  has a lump on his chest that is being evaluated.   Past medications for mental health diagnoses include: Elavil, Xanax, Celexa, Seroquel, Wellbutrin, Ambien, Depakote, Lamictal, Mirtazepine, Trazodone  Allergies: Meloxicam and Codeine  Current Medications:  Current Outpatient Medications:  .  busPIRone (BUSPAR) 30 MG tablet, TAKE 1 TABLET (30 MG TOTAL) BY MOUTH 2 (TWO) TIMES DAILY., Disp: 180 tablet, Rfl: 1 .  dexlansoprazole (DEXILANT) 60 MG capsule, Take 60 mg by mouth daily., Disp: , Rfl:  .  losartan-hydrochlorothiazide (HYZAAR) 100-12.5 MG tablet, Take 1 tablet by mouth daily., Disp: , Rfl:  .  mirtazapine (REMERON) 7.5 MG tablet, TAKE 1-2 TABLETS (7.5-15 MG TOTAL) BY MOUTH AT BEDTIME AS NEEDED (SLEEP)., Disp: 180 tablet, Rfl: 1 .  QUEtiapine (SEROQUEL) 400 MG tablet, Take 2.5 tablets (1,000 mg total) by mouth at bedtime., Disp: 225 tablet, Rfl: 1 .  thiamine 100 MG tablet, Take 1 tablet (100 mg total) by mouth daily., Disp: 30 tablet, Rfl: 1 .  acetaminophen (TYLENOL) 325 MG tablet, Take 650 mg by mouth every 6 (six) hours as needed. (Patient not taking: Reported on 05/20/2020), Disp: , Rfl:  .  fexofenadine (ALLEGRA) 180 MG tablet, Take 180 mg by mouth daily. (Patient not taking: Reported on 02/10/2020), Disp: , Rfl:  .  gabapentin (NEURONTIN) 300 MG capsule, Take 3 capsules (900 mg total) by mouth at bedtime., Disp: 90 capsule, Rfl: 0 .  Lidocaine-Hydrocortisone Ace (ANAMANTLE HC) 3-2.5 % KIT, Apply 1 application topically 4 (four)  times daily as needed (for anal pain / irritation). (Patient not taking: Reported on 09/21/2018), Disp: 1 each, Rfl: 2 .  naltrexone (DEPADE) 50 MG tablet, Take 1 tablet (50 mg total) by mouth in the morning. (Patient not taking: Reported on 02/10/2020), Disp: 30 tablet, Rfl: 1 .  oxyCODONE (OXY IR/ROXICODONE) 5 MG immediate release tablet, Take 1-2 tablets (5-10 mg total) by mouth every 4 (four) hours as needed. (Patient not  taking: Reported on 09/21/2018), Disp: 30 tablet, Rfl: 0 .  pseudoephedrine (SUDAFED) 120 MG 12 hr tablet, Take 120 mg by mouth daily. (Patient not taking: Reported on 02/10/2020), Disp: , Rfl:  Medication Side Effects: none  Family Medical/ Social History: Changes?  See HPI.  MENTAL HEALTH EXAM:  There were no vitals taken for this visit.There is no height or weight on file to calculate BMI.  General Appearance: Casual, Neat and Well Groomed  Eye Contact:  Good  Speech:  Clear and Coherent  Volume:  Normal  Mood:  Euthymic  Affect:  Appropriate  Thought Process:  Goal Directed and Descriptions of Associations: Intact  Orientation:  Full (Time, Place, and Person)  Thought Content: Logical   Suicidal Thoughts:  No  Homicidal Thoughts:  No  Memory:  WNL  Judgement:  Good  Insight:  Good  Psychomotor Activity:  Normal  Concentration:  Concentration: Good and Attention Span: Good  Recall:  Good  Fund of Knowledge: Good  Language: Good  Assets:  Desire for Improvement  ADL's:  Intact  Cognition: WNL  Prognosis:  Good   Most recent pertinent labs 02/17/2020 Hgb A1C 6.1  DIAGNOSES:    ICD-10-CM   1. Insomnia, unspecified type  G47.00   2. Alcohol abuse  F10.10   3. Generalized anxiety disorder  F41.1   4. Bipolar I disorder ([place])  F31.9   5. 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 I provided 40 minutes of face to face time during this encounter.  Discussed the chronic insomnia. Sleep hygiene discussed.  Recommend sleep study to help dx and treat. He may be having periods of hypoxia/apnea without other usual signs, ie obesity or snoring. Will set up in-home study through [facility]  Because of the current alcohol use, I can not Rx any controlled substances. Case was discussed with Dr. Clovis Pu who recommended increase of Gabapentin. Pt wants Xanax. Denied.  Counseling  concerning ETOH use. Not ready to quit.  He's not interested in Accamprosate and/or naltrexone.  Signed a release of information to get labs. Increase Gabapentin 300 mg, 3 qhs.  Continue Mirtazepine 7.5 mg, 1-2 qhs prn. It's ok to take with other meds. It's helpful at least putting him to sleep.  Continue BuSpar 30 mg 1 p.o. twice daily. Continue Seroquel 400 mg 2.5 pills po qhs.  Contine thiamine, Multivitamin, B Complex, Vit D, Fish oil Discontinue Ambien. Recommend AA or celebrate recovery. Recommend counseling. Return in 6-8 weeks.   Donnal Moat, PA-C

## 2020-05-27 ENCOUNTER — Telehealth: Payer: Self-pay | Admitting: Physician Assistant

## 2020-05-27 NOTE — Telephone Encounter (Signed)
Referall for Home sleep study faxed to [facility]

## 2020-06-01 NOTE — Telephone Encounter (Signed)
[facility] has reported that Guy Mayer decided not to take the Home Sleep Test ordered by you.

## 2020-06-01 NOTE — Telephone Encounter (Signed)
Noted  

## 2020-06-11 ENCOUNTER — Telehealth: Payer: Self-pay | Admitting: Physician Assistant

## 2020-06-11 NOTE — Telephone Encounter (Signed)
Pt LM on VM requesting refill for Gabapentin 300 mg 3 @ bedtime for sleep. @ [facility]  [place] Apt 1/7

## 2020-06-23 ENCOUNTER — Other Ambulatory Visit: Payer: Self-pay

## 2020-06-23 MED ORDER — GABAPENTIN 300 MG PO CAPS: 900.0000 mg | ORAL_CAPSULE | Freq: Every day | ORAL | 0 refills | Status: DC

## 2020-06-23 NOTE — Telephone Encounter (Signed)
Rx sent 

## 2020-07-10 ENCOUNTER — Ambulatory Visit: Payer: 59 | Admitting: Physician Assistant

## 2020-08-25 ENCOUNTER — Ambulatory Visit (INDEPENDENT_AMBULATORY_CARE_PROVIDER_SITE_OTHER): Payer: 59 | Admitting: Physician Assistant

## 2020-08-25 ENCOUNTER — Encounter: Payer: Self-pay | Admitting: Physician Assistant

## 2020-08-25 ENCOUNTER — Other Ambulatory Visit: Payer: Self-pay

## 2020-08-25 DIAGNOSIS — F1010 Alcohol abuse, uncomplicated: Secondary | ICD-10-CM | POA: Diagnosis not present

## 2020-08-25 DIAGNOSIS — G4700 Insomnia, unspecified: Secondary | ICD-10-CM | POA: Diagnosis not present

## 2020-08-25 DIAGNOSIS — F319 Bipolar disorder, unspecified: Secondary | ICD-10-CM | POA: Diagnosis not present

## 2020-08-25 DIAGNOSIS — F411 Generalized anxiety disorder: Secondary | ICD-10-CM

## 2020-08-25 MED ORDER — BUSPIRONE HCL 30 MG PO TABS: 30.0000 mg | ORAL_TABLET | Freq: Two times a day (BID) | ORAL | 1 refills | Status: DC

## 2020-08-25 MED ORDER — GABAPENTIN 300 MG PO CAPS: 900.0000 mg | ORAL_CAPSULE | Freq: Every day | ORAL | 1 refills | Status: DC

## 2020-08-25 NOTE — Progress Notes (Signed)
[facility]  Patient ID: Guy Mayer,  MRN: [id number]  PCP: Thurman Coyer, MD  Date of Evaluation: 08/25/2020 Time spent:40 minutes  Chief Complaint:  Chief Complaint    Anxiety; Eating Disorder      HISTORY/CURRENT STATUS: HPI routine med check.  Will be starting a new job in a week or so. 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 Stress from not working.  Reports 2 drinks on the weekends but that's it. Not in AA or other program. Still has a lot of anxiety but says he's managing.   Patient denies loss of interest in usual activities and is able to enjoy things.  Denies decreased energy or motivation.  Appetite has not changed.  No extreme sadness, tearfulness, or feelings of hopelessness.  Denies any changes in concentration, making decisions or remembering things.  Denies suicidal or homicidal thoughts.  Patient denies increased energy with decreased need for sleep, no increased talkativeness, no racing thoughts, no impulsivity or risky behaviors, no increased spending, no increased libido, no paranoia, and no hallucinations.  Denies dizziness, syncope, seizures, numbness, tingling, tremor, tics, unsteady gait, slurred speech, confusion. Denies muscle or joint pain, stiffness, or dystonia. Denies unexplained weight loss, frequent infections, or sores that heal slowly.  No polyphagia, polydipsia, or polyuria. Denies visual changes or paresthesias.   Individual Medical History/ Review of Systems: Changes? :No    Past medications for mental health diagnoses include: Elavil, Xanax, Celexa, Seroquel, Wellbutrin, Ambien, Depakote, Lamictal, Mirtazepine, Trazodone  Allergies: Meloxicam and Codeine  Current Medications:  Current Outpatient Medications:  .  acetaminophen (TYLENOL) 325 MG tablet, Take 650 mg by mouth every 6 (six) hours as needed., Disp: , Rfl:  .  dexlansoprazole (DEXILANT) 60 MG capsule, Take 60 mg by mouth daily., Disp: ,  Rfl:  .  losartan-hydrochlorothiazide (HYZAAR) 100-12.5 MG tablet, Take 1 tablet by mouth daily., Disp: , Rfl:  .  mirtazapine (REMERON) 7.5 MG tablet, TAKE 1-2 TABLETS (7.5-15 MG TOTAL) BY MOUTH AT BEDTIME AS NEEDED (SLEEP)., Disp: 180 tablet, Rfl: 1 .  QUEtiapine (SEROQUEL) 400 MG tablet, Take 2.5 tablets (1,000 mg total) by mouth at bedtime., Disp: 225 tablet, Rfl: 1 .  thiamine 100 MG tablet, Take 1 tablet (100 mg total) by mouth daily., Disp: 30 tablet, Rfl: 1 .  busPIRone (BUSPAR) 30 MG tablet, Take 1 tablet (30 mg total) by mouth 2 (two) times daily., Disp: 180 tablet, Rfl: 1 .  fexofenadine (ALLEGRA) 180 MG tablet, Take 180 mg by mouth daily. (Patient not taking: No sig reported), Disp: , Rfl:  .  gabapentin (NEURONTIN) 300 MG capsule, Take 3 capsules (900 mg total) by mouth at bedtime., Disp: 270 capsule, Rfl: 1 .  Lidocaine-Hydrocortisone Ace (ANAMANTLE HC) 3-2.5 % KIT, Apply 1 application topically 4 (four) times daily as needed (for anal pain / irritation). (Patient not taking: No sig reported), Disp: 1 each, Rfl: 2 .  oxyCODONE (OXY IR/ROXICODONE) 5 MG immediate release tablet, Take 1-2 tablets (5-10 mg total) by mouth every 4 (four) hours as needed. (Patient not taking: No sig reported), Disp: 30 tablet, Rfl: 0 .  pseudoephedrine (SUDAFED) 120 MG 12 hr tablet, Take 120 mg by mouth daily. (Patient not taking: No sig reported), Disp: , Rfl:  Medication Side Effects: none  Family Medical/ Social History: Changes?  See HPI.  MENTAL HEALTH EXAM:  There were no vitals taken for this visit.There is no height or weight on file to calculate BMI.  General Appearance:  Casual, Neat and Well Groomed  Eye Contact:  Good  Speech:  Clear and Coherent  Volume:  Normal  Mood:  Euthymic  Affect:  Appropriate  Thought Process:  Goal Directed and Descriptions of Associations: Intact  Orientation:  Full (Time, Place, and Person)  Thought Content: Logical   Suicidal Thoughts:  No  Homicidal  Thoughts:  No  Memory:  WNL  Judgement:  Good  Insight:  Good  Psychomotor Activity:  Normal  Concentration:  Concentration: Good and Attention Span: Good  Recall:  Good  Fund of Knowledge: Good  Language: Good  Assets:  Desire for Improvement  ADL's:  Intact  Cognition: WNL  Prognosis:  Good   Most recent pertinent labs 02/19/2020 Hgb A1C 6.1 Lipid profile total cholesterol 195, triglycerides 159, HDL 74, LDL 90  DIAGNOSES:    ICD-10-CM   1. Generalized anxiety disorder  F41.1   2. Bipolar I disorder ([place])  F31.9   3. Alcohol abuse  F10.10   4. Insomnia, unspecified type  G47.00     Receiving Psychotherapy: No    RECOMMENDATIONS:  PDMP was reviewed.  Noted that patient received prescription for Xanax, 30 pills on 07/31/2020 by Dr. Charlcie Cradle.  I did not discuss this with the patient. I provided 40 minutes of face to face time during this encounter discussing his response to treatment as well as reviewing labs.  He seems to be doing well as far as the medications go so no changes will be made.  I again recommend Alcoholics Anonymous or celebrate recovery but he does not want to.   Continue BuSpar 30 mg, 1 p.o. twice daily. Continue Gabapentin 300 mg, 3 qhs.  Continue Mirtazepine 7.5 mg, 1-2 qhs prn. Continue Seroquel 400 mg 2.5 pills po qhs.  Contine thiamine, Multivitamin, B Complex, Vit D, Fish oil. Recommend counseling. Return in 6 months.   Donnal Moat, PA-C

## 2020-11-01 ENCOUNTER — Other Ambulatory Visit: Payer: Self-pay | Admitting: Physician Assistant

## 2020-11-15 ENCOUNTER — Other Ambulatory Visit: Payer: Self-pay | Admitting: Physician Assistant

## 2020-12-31 ENCOUNTER — Other Ambulatory Visit: Payer: Self-pay | Admitting: Physician Assistant

## 2021-02-26 ENCOUNTER — Ambulatory Visit: Payer: 59 | Admitting: Physician Assistant

## 2021-04-15 ENCOUNTER — Other Ambulatory Visit: Payer: Self-pay

## 2021-04-15 ENCOUNTER — Ambulatory Visit (INDEPENDENT_AMBULATORY_CARE_PROVIDER_SITE_OTHER): Payer: 59 | Admitting: Physician Assistant

## 2021-04-15 ENCOUNTER — Encounter: Payer: Self-pay | Admitting: Physician Assistant

## 2021-04-15 DIAGNOSIS — G4700 Insomnia, unspecified: Secondary | ICD-10-CM

## 2021-04-15 DIAGNOSIS — F1010 Alcohol abuse, uncomplicated: Secondary | ICD-10-CM

## 2021-04-15 DIAGNOSIS — F411 Generalized anxiety disorder: Secondary | ICD-10-CM

## 2021-04-15 DIAGNOSIS — F319 Bipolar disorder, unspecified: Secondary | ICD-10-CM | POA: Diagnosis not present

## 2021-04-15 MED ORDER — BUSPIRONE HCL 30 MG PO TABS: 30.0000 mg | ORAL_TABLET | Freq: Two times a day (BID) | ORAL | 1 refills | Status: DC

## 2021-04-15 MED ORDER — QUETIAPINE FUMARATE 400 MG PO TABS: ORAL_TABLET | ORAL | 1 refills | Status: DC

## 2021-04-15 MED ORDER — GABAPENTIN 300 MG PO CAPS: 900.0000 mg | ORAL_CAPSULE | Freq: Every day | ORAL | 1 refills | Status: DC

## 2021-04-15 MED ORDER — MIRTAZAPINE 7.5 MG PO TABS: ORAL_TABLET | ORAL | 1 refills | Status: DC

## 2021-04-15 NOTE — Progress Notes (Signed)
[facility]  Patient ID: Guy Mayer,  MRN: [id number]  PCP: Guy Coyer, MD  Date of Evaluation: 04/15/2021  time spent:40 minutes  Chief Complaint:  Chief Complaint   Anxiety; Depression; Insomnia; Follow-up      HISTORY/CURRENT STATUS: HPI routine med check.  States he is doing well.  He got a new job a few months ago and that is going well.  It is close to home so he is not having to travel much at all.  States he quit drinking 2 to 3 months ago.  His PCP gives him Xanax for evening use only.  Not having a lot of anxiety during the day.  He sleeps well with his current medication regimen but does not sleep as well if he does not have the Xanax.  Patient denies loss of interest in usual activities and is able to enjoy things.  Denies decreased energy or motivation.  Appetite has not changed.  No extreme sadness, tearfulness, or feelings of hopelessness.  Denies any changes in concentration, making decisions or remembering things.  Denies suicidal or homicidal thoughts.  Patient denies increased energy with decreased need for sleep, no increased talkativeness, no racing thoughts, no impulsivity or risky behaviors, no increased spending, no increased libido, no paranoia, and no hallucinations.  Denies dizziness, syncope, seizures, numbness, tingling, tremor, tics, unsteady gait, slurred speech, confusion. Denies muscle or joint pain, stiffness, or dystonia. Denies unexplained weight loss, frequent infections, or sores that heal slowly.  No polyphagia, polydipsia, or polyuria. Denies visual changes or paresthesias.   Individual Medical History/ Review of Systems: Changes? Guy Mayer    Saw Nephrology in April for AKI. Losartan/HCTZ decreased. Following w/ neph as directed.   Past medications for mental health diagnoses include: Elavil, Xanax, Celexa, Seroquel, Wellbutrin, Ambien, Depakote, Lamictal, Mirtazepine, Trazodone  Allergies: Meloxicam and Codeine  Current  Medications:  Current Outpatient Medications:    acetaminophen (TYLENOL) 325 MG tablet, Take 650 mg by mouth every 6 (six) hours as needed., Disp: , Rfl:    ALPRAZolam (XANAX) 0.5 MG tablet, Take 0.5 mg by mouth at bedtime as needed., Disp: , Rfl:    dexlansoprazole (DEXILANT) 60 MG capsule, Take 60 mg by mouth daily., Disp: , Rfl:    losartan-hydrochlorothiazide (HYZAAR) 100-12.5 MG tablet, Take 1 tablet by mouth daily., Disp: , Rfl:    thiamine 100 MG tablet, Take 1 tablet (100 mg total) by mouth daily., Disp: 30 tablet, Rfl: 1   busPIRone (BUSPAR) 30 MG tablet, Take 1 tablet (30 mg total) by mouth 2 (two) times daily., Disp: 180 tablet, Rfl: 1   fexofenadine (ALLEGRA) 180 MG tablet, Take 180 mg by mouth daily. (Patient not taking: No sig reported), Disp: , Rfl:    gabapentin (NEURONTIN) 300 MG capsule, Take 3 capsules (900 mg total) by mouth at bedtime., Disp: 270 capsule, Rfl: 1   Lidocaine-Hydrocortisone Ace (ANAMANTLE HC) 3-2.5 % KIT, Apply 1 application topically 4 (four) times daily as needed (for anal pain / irritation). (Patient not taking: No sig reported), Disp: 1 each, Rfl: 2   mirtazapine (REMERON) 7.5 MG tablet, TAKE 1-2 TABLETS BY MOUTH AT BEDTIME AS NEEDED (SLEEP)., Disp: 180 tablet, Rfl: 1   oxyCODONE (OXY IR/ROXICODONE) 5 MG immediate release tablet, Take 1-2 tablets (5-10 mg total) by mouth every 4 (four) hours as needed. (Patient not taking: No sig reported), Disp: 30 tablet, Rfl: 0   pseudoephedrine (SUDAFED) 120 MG 12 hr tablet, Take 120 mg by mouth daily. (Patient not  taking: No sig reported), Disp: , Rfl:    QUEtiapine (SEROQUEL) 400 MG tablet, 2.5 po qhs, Disp: 225 tablet, Rfl: 1 Medication Side Effects: none  Family Medical/ Social History: Changes?  New job  MENTAL HEALTH EXAM:  There were no vitals taken for this visit.There is no height or weight on file to calculate BMI.  General Appearance: Casual, Neat and Well Groomed  Eye Contact:  Good  Speech:  Clear and  Coherent  Volume:  Normal  Mood:  Euthymic  Affect:  Appropriate  Thought Process:  Goal Directed and Descriptions of Associations: Circumstantial  Orientation:  Full (Time, Place, and Person)  Thought Content: Logical   Suicidal Thoughts:  No  Homicidal Thoughts:  No  Memory:  WNL  Judgement:  Good  Insight:  Good  Psychomotor Activity:  Normal  Concentration:  Concentration: Good and Attention Span: Good  Recall:  Good  Fund of Knowledge: Good  Language: Good  Assets:  Desire for Improvement  ADL's:  Intact  Cognition: WNL  Prognosis:  Good   Most recent pertinent labs 09/23/2020 Glucose 107, Na 131, Cr 2.12, GFR 36 10/26/2020 Glucose 100, Na 135, Cr 1.26, GFR 67 CBC see on chart Lipids TC 220, Trig 259, HDL 70, LDL 112 Vit D 26 01/22/2021 CBC improved H/H  DIAGNOSES:    ICD-10-CM   1. Generalized anxiety disorder  F41.1     2. Bipolar I disorder ([place])  F31.9     3. Insomnia, unspecified type  G47.00     4. Alcohol abuse  F10.10       Receiving Psychotherapy: No    RECOMMENDATIONS:  PDMP was reviewed.  Xanax prescribed last 04/07/2021, 03/09/2021, 02/03/2021 per Guy Elliot, PA-C.  Received Xanax 12/31/2020, 11/09/2020, 10/04/2020, 09/02/2020 per Guy Mayer. I provided 40 minutes of face to face time during this encounter, including time spent before and after the visit in records review, medical decision making, counseling pertinent to today's visit, and charting. Noted the issue w/ abnl labs and visit w/ neph since LOV. He is doing well as far as mental health medications go.  No changes will be made.  I did discuss the fact that he is getting Xanax from another provider.  He states they are aware of his past alcohol history and are okay prescribing it.  Briefly discussed alcohol use, I'm glad he quit drinking, continue.  Briefly discussed sleep hygiene.  Continue Xanax 0.5 mg, 1 p.o. nightly per his PCP. Continue BuSpar 30 mg, 1 p.o. twice daily. Continue  Gabapentin 300 mg, 3 qhs.  Continue Mirtazepine 7.5 mg, 1-2 qhs prn. Continue Seroquel 400 mg 2.5 pills po qhs.  Contine thiamine, Multivitamin, B Complex, Vit D, Fish oil. Return in 6 months.   Guy Moat, PA-C

## 2021-10-14 ENCOUNTER — Encounter: Payer: Self-pay | Admitting: Physician Assistant

## 2021-10-14 ENCOUNTER — Ambulatory Visit (INDEPENDENT_AMBULATORY_CARE_PROVIDER_SITE_OTHER): Payer: 59 | Admitting: Physician Assistant

## 2021-10-14 DIAGNOSIS — F411 Generalized anxiety disorder: Secondary | ICD-10-CM

## 2021-10-14 DIAGNOSIS — G4700 Insomnia, unspecified: Secondary | ICD-10-CM

## 2021-10-14 DIAGNOSIS — F1021 Alcohol dependence, in remission: Secondary | ICD-10-CM

## 2021-10-14 DIAGNOSIS — F319 Bipolar disorder, unspecified: Secondary | ICD-10-CM

## 2021-10-14 MED ORDER — QUETIAPINE FUMARATE 400 MG PO TABS: ORAL_TABLET | ORAL | 1 refills | Status: DC

## 2021-10-14 MED ORDER — MIRTAZAPINE 7.5 MG PO TABS: ORAL_TABLET | ORAL | 1 refills | Status: DC

## 2021-10-14 MED ORDER — BUSPIRONE HCL 30 MG PO TABS
30.0000 mg | ORAL_TABLET | Freq: Two times a day (BID) | ORAL | 1 refills | Status: DC
Start: 2021-10-14 — End: 2022-04-21

## 2021-10-14 MED ORDER — GABAPENTIN 300 MG PO CAPS: 900.0000 mg | ORAL_CAPSULE | Freq: Every day | ORAL | 1 refills | Status: DC

## 2021-10-14 NOTE — Progress Notes (Signed)
[facility] ? ?Patient ID: Guy Mayer,  ?MRN: [id number] ? ?PCP: Cloward, Dianna Rossetti, MD ? ?Date of Evaluation: 10/14/2021 ?time spent:25 minutes ? ?Chief Complaint:  ?Chief Complaint   ?Anxiety; Depression; Follow-up ?  ? ? ?Virtual Visit via Telehealth ? ?I connected with patient by telephone, with their informed consent, and verified patient privacy and that I am speaking with the correct person using two identifiers.  I am private, in my office and the patient is at work. ? ?I discussed the limitations, risks, security and privacy concerns of performing an evaluation and management service by telephone and the availability of in person appointments. I also discussed with the patient that there may be a patient responsible charge related to this service. The patient expressed understanding and agreed to proceed. ?  ?I discussed the assessment and treatment plan with the patient. The patient was provided an opportunity to ask questions and all were answered. The patient agreed with the plan and demonstrated an understanding of the instructions. ?  ?The patient was advised to call back or seek an in-person evaluation if the symptoms worsen or if the condition fails to improve as anticipated. ? ?I provided 25 minutes of non-face-to-face time during this encounter. ? ? ?HISTORY/CURRENT STATUS: ?HPI routine 45-monthmed check. ? ?States he is doing well.  Has been sober since July 2022.  Work is going well, busier now and that makes the day go by faster.  Still deals with some anxiety, his PCP gives him Xanax.  Not having panic attacks.  Sleeps well most of the time. ? ?Patient denies loss of interest in usual activities and is able to enjoy things.  Denies decreased energy or motivation.  Appetite has not changed.  No extreme sadness, tearfulness, or feelings of hopelessness.  Denies any changes in concentration, making decisions or remembering things.  ADLs and personal hygiene are normal.  Sleeps well  most of the time.  Denies suicidal or homicidal thoughts. ? ?Patient denies increased energy with decreased need for sleep, no increased talkativeness, no racing thoughts, no impulsivity or risky behaviors, no increased spending, no increased libido, no grandiosity, no increased irritability or anger, no paranoia, and no hallucinations. ? ?Denies dizziness, syncope, seizures, numbness, tingling, tremor, tics, unsteady gait, slurred speech, confusion. Denies muscle or joint pain, stiffness, or dystonia. Denies unexplained weight loss, frequent infections, or sores that heal slowly.  No polyphagia, polydipsia, or polyuria. Denies visual changes or paresthesias.  ? ?Individual Medical History/ Review of Systems: Changes? :No     ? ?Past medications for mental health diagnoses include: ?Elavil, Xanax, Celexa, Seroquel, Wellbutrin, Ambien, Depakote, Lamictal, Mirtazepine, Trazodone ? ?Allergies: Meloxicam and Codeine ? ?Current Medications:  ?Current Outpatient Medications:  ?  acetaminophen (TYLENOL) 325 MG tablet, Take 650 mg by mouth every 6 (six) hours as needed., Disp: , Rfl:  ?  ALPRAZolam (XANAX) 0.5 MG tablet, Take 0.5 mg by mouth at bedtime as needed., Disp: , Rfl:  ?  dexlansoprazole (DEXILANT) 60 MG capsule, Take 60 mg by mouth daily., Disp: , Rfl:  ?  losartan-hydrochlorothiazide (HYZAAR) 100-12.5 MG tablet, Take 1 tablet by mouth daily., Disp: , Rfl:  ?  thiamine 100 MG tablet, Take 1 tablet (100 mg total) by mouth daily., Disp: 30 tablet, Rfl: 1 ?  busPIRone (BUSPAR) 30 MG tablet, Take 1 tablet (30 mg total) by mouth 2 (two) times daily., Disp: 180 tablet, Rfl: 1 ?  fexofenadine (ALLEGRA) 180 MG tablet, Take 180 mg by mouth  daily. (Patient not taking: Reported on 02/10/2020), Disp: , Rfl:  ?  gabapentin (NEURONTIN) 300 MG capsule, Take 3 capsules (900 mg total) by mouth at bedtime., Disp: 270 capsule, Rfl: 1 ?  Lidocaine-Hydrocortisone Ace (ANAMANTLE HC) 3-2.5 % KIT, Apply 1 application topically 4 (four)  times daily as needed (for anal pain / irritation). (Patient not taking: Reported on 09/21/2018), Disp: 1 each, Rfl: 2 ?  mirtazapine (REMERON) 7.5 MG tablet, TAKE 1-2 TABLETS BY MOUTH AT BEDTIME AS NEEDED (SLEEP)., Disp: 180 tablet, Rfl: 1 ?  oxyCODONE (OXY IR/ROXICODONE) 5 MG immediate release tablet, Take 1-2 tablets (5-10 mg total) by mouth every 4 (four) hours as needed. (Patient not taking: Reported on 09/21/2018), Disp: 30 tablet, Rfl: 0 ?  pseudoephedrine (SUDAFED) 120 MG 12 hr tablet, Take 120 mg by mouth daily. (Patient not taking: Reported on 02/10/2020), Disp: , Rfl:  ?  QUEtiapine (SEROQUEL) 400 MG tablet, 2.5 po qhs, Disp: 225 tablet, Rfl: 1 ?Medication Side Effects: none ? ?Family Medical/ Social History: Changes?   ? ?MENTAL HEALTH EXAM: ? ?There were no vitals taken for this visit.There is no height or weight on file to calculate BMI.  ?General Appearance:  Unable to assess  ?Eye Contact:   Unable to assess  ?Speech:  Clear and Coherent  ?Volume:  Normal  ?Mood:  Euthymic  ?Affect:   Unable to assess  ?Thought Process:  Goal Directed and Descriptions of Associations: Circumstantial  ?Orientation:  Full (Time, Place, and Person)  ?Thought Content: Logical   ?Suicidal Thoughts:  No  ?Homicidal Thoughts:  No  ?Memory:  WNL  ?Judgement:  Good  ?Insight:  Good  ?Psychomotor Activity:   Unable to assess  ?Concentration:  Concentration: Good and Attention Span: Good  ?Recall:  Good  ?Fund of Knowledge: Good  ?Language: Good  ?Assets:  Desire for Improvement  ?ADL's:  Intact  ?Cognition: WNL  ?Prognosis:  Good  ? ?08/19/2021 ?BMP completely normal ? ?06/14/2021 ?CBC White count 11.8, hemoglobin 12.8, hematocrit 37.3, platelets normal. ?BMP potassium was 2.8 otherwise normal ?Lipid panel total cholesterol 216, triglycerides 200, HDL 67, LDL 149 ? ? ? ?DIAGNOSES:  ?  ICD-10-CM   ?1. Bipolar I disorder ([place])  F31.9   ?  ?2. Generalized anxiety disorder  F41.1   ?  ?3. Insomnia, unspecified type  G47.00   ?  ?4.  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  ? ? ? ?Receiving Psychotherapy: No  ? ? ?RECOMMENDATIONS:  ?PDMP was reviewed.  Xanax prescribed last 1 for 09/20/2021 per Crista Elliot, PA-C.  ?I provided 25 minutes of non-face-to-face time during this encounter, including time spent before and after the visit in records review, medical decision making, counseling pertinent to today's visit, and charting.  ?He is doing well so no changes in medications need to be made. ?Congratulations on 6 months of sobriety! ? ?Continue Xanax 0.5 mg, 1 p.o. nightly per his PCP. ?Continue BuSpar 30 mg, 1 p.o. twice daily. ?Continue Gabapentin 300 mg, 3 qhs.  ?Continue Mirtazepine 7.5 mg, 1-2 qhs prn. ?Continue Seroquel 400 mg 2.5 pills po qhs.  ?Contine thiamine, Multivitamin, B Complex, Vit D, Fish oil. ?Return in 6 months.  ? ?Donnal Moat, PA-C  ?

## 2022-04-18 ENCOUNTER — Telehealth: Payer: Self-pay | Admitting: Physician Assistant

## 2022-04-18 ENCOUNTER — Other Ambulatory Visit: Payer: Self-pay

## 2022-04-18 MED ORDER — GABAPENTIN 300 MG PO CAPS: 900.0000 mg | ORAL_CAPSULE | Freq: Every day | ORAL | 1 refills | Status: DC

## 2022-04-18 NOTE — Telephone Encounter (Signed)
Pt called and said that he needs a refill on his gabapentin 300 mg. Pharmacy [facility] on [address] in [place]

## 2022-04-18 NOTE — Telephone Encounter (Signed)
Sent!

## 2022-04-21 ENCOUNTER — Encounter: Payer: Self-pay | Admitting: Physician Assistant

## 2022-04-21 ENCOUNTER — Ambulatory Visit (INDEPENDENT_AMBULATORY_CARE_PROVIDER_SITE_OTHER): Payer: 59 | Admitting: Physician Assistant

## 2022-04-21 DIAGNOSIS — F319 Bipolar disorder, unspecified: Secondary | ICD-10-CM

## 2022-04-21 DIAGNOSIS — F411 Generalized anxiety disorder: Secondary | ICD-10-CM

## 2022-04-21 DIAGNOSIS — F1021 Alcohol dependence, in remission: Secondary | ICD-10-CM | POA: Diagnosis not present

## 2022-04-21 DIAGNOSIS — G4700 Insomnia, unspecified: Secondary | ICD-10-CM

## 2022-04-21 MED ORDER — MIRTAZAPINE 7.5 MG PO TABS: ORAL_TABLET | ORAL | 1 refills | Status: DC

## 2022-04-21 MED ORDER — BUSPIRONE HCL 30 MG PO TABS: 30.0000 mg | ORAL_TABLET | Freq: Two times a day (BID) | ORAL | 1 refills | Status: DC

## 2022-04-21 MED ORDER — QUETIAPINE FUMARATE 400 MG PO TABS: ORAL_TABLET | ORAL | 1 refills | Status: DC

## 2022-04-21 NOTE — Progress Notes (Signed)
[facility]  Patient ID: Guy Mayer,  MRN: [id number]  PCP: Thurman Coyer, MD  Date of Evaluation: 04/21/2022 time spent:25 minutes  Chief Complaint:  Chief Complaint   Anxiety; Insomnia; Depression; Follow-up    Virtual Visit via Telehealth  I connected with patient by telephone, with their informed consent, and verified patient privacy and that I am speaking with the correct person using two identifiers.  I am private, in my office and the patient is at work.  I discussed the limitations, risks, security and privacy concerns of performing an evaluation and management service by telephone and the availability of in person appointments. I also discussed with the patient that there may be a patient responsible charge related to this service. The patient expressed understanding and agreed to proceed.   I discussed the assessment and treatment plan with the patient. The patient was provided an opportunity to ask questions and all were answered. The patient agreed with the plan and demonstrated an understanding of the instructions.   The patient was advised to call back or seek an in-person evaluation if the symptoms worsen or if the condition fails to improve as anticipated.  I provided 25 minutes of non-face-to-face time during this encounter.  HISTORY/CURRENT STATUS: HPI routine 77-monthmed check.  Doing well. Job is going good. Has been sober for almost a year and a half now.  Does not have a lot of anxiety but does take Xanax at night only, prescribed by his PCP.  If he does not take it he has trouble falling asleep.  He also takes the mirtazapine which helps.  He feels rested each morning, at least most of the time.  His daughter had her third child a few weeks ago.  His other daughter is expecting a baby at the end of the year.  He is happy that they are doing well.  Things are good at home with his wife.  Patient is able to enjoy things.  Energy and  motivation are good.  No extreme sadness, tearfulness, or feelings of hopelessness.  ADLs and personal hygiene are normal.   Denies any changes in concentration, making decisions, or remembering things.  Appetite has not changed.  Weight is stable.  Not isolating.  Denies suicidal or homicidal thoughts.  Patient denies increased energy with decreased need for sleep, increased talkativeness, racing thoughts, impulsivity or risky behaviors, increased spending, increased libido, grandiosity, increased irritability or anger, paranoia, or hallucinations.  Denies dizziness, syncope, seizures, numbness, tingling, tremor, tics, unsteady gait, slurred speech, confusion. Denies muscle or joint pain, stiffness, or dystonia. Denies unexplained weight loss, frequent infections, or sores that heal slowly.  No polyphagia, polydipsia, or polyuria. Denies visual changes or paresthesias.   Individual Medical History/ Review of Systems: Changes? :No      Past medications for mental health diagnoses include: Elavil, Xanax, Celexa, Seroquel, Wellbutrin, Ambien, Depakote, Lamictal, Mirtazepine, Trazodone  Allergies: Meloxicam and Codeine  Current Medications:  Current Outpatient Medications:    acetaminophen (TYLENOL) 325 MG tablet, Take 650 mg by mouth every 6 (six) hours as needed., Disp: , Rfl:    ALPRAZolam (XANAX) 0.5 MG tablet, Take 0.5 mg by mouth at bedtime as needed., Disp: , Rfl:    dexlansoprazole (DEXILANT) 60 MG capsule, Take 60 mg by mouth daily., Disp: , Rfl:    gabapentin (NEURONTIN) 300 MG capsule, Take 3 capsules (900 mg total) by mouth at bedtime., Disp: 270 capsule, Rfl: 1   losartan-hydrochlorothiazide (HYZAAR) 100-12.5 MG tablet,  Take 1 tablet by mouth daily., Disp: , Rfl:    thiamine 100 MG tablet, Take 1 tablet (100 mg total) by mouth daily., Disp: 30 tablet, Rfl: 1   busPIRone (BUSPAR) 30 MG tablet, Take 1 tablet (30 mg total) by mouth 2 (two) times daily., Disp: 180 tablet, Rfl: 1    fexofenadine (ALLEGRA) 180 MG tablet, Take 180 mg by mouth daily. (Patient not taking: Reported on 02/10/2020), Disp: , Rfl:    Lidocaine-Hydrocortisone Ace (ANAMANTLE HC) 3-2.5 % KIT, Apply 1 application topically 4 (four) times daily as needed (for anal pain / irritation). (Patient not taking: Reported on 09/21/2018), Disp: 1 each, Rfl: 2   mirtazapine (REMERON) 7.5 MG tablet, TAKE 1-2 TABLETS BY MOUTH AT BEDTIME AS NEEDED (SLEEP)., Disp: 180 tablet, Rfl: 1   oxyCODONE (OXY IR/ROXICODONE) 5 MG immediate release tablet, Take 1-2 tablets (5-10 mg total) by mouth every 4 (four) hours as needed. (Patient not taking: Reported on 09/21/2018), Disp: 30 tablet, Rfl: 0   pseudoephedrine (SUDAFED) 120 MG 12 hr tablet, Take 120 mg by mouth daily. (Patient not taking: Reported on 02/10/2020), Disp: , Rfl:    QUEtiapine (SEROQUEL) 400 MG tablet, 2.5 po qhs, Disp: 225 tablet, Rfl: 1 Medication Side Effects: none  Family Medical/ Social History: Changes?  no  MENTAL HEALTH EXAM:  There were no vitals taken for this visit.There is no height or weight on file to calculate BMI.  General Appearance:  Unable to assess  Eye Contact:   Unable to assess  Speech:  Clear and Coherent  Volume:  Normal  Mood:  Euthymic  Affect:   Unable to assess  Thought Process:  Goal Directed and Descriptions of Associations: Circumstantial  Orientation:  Full (Time, Place, and Person)  Thought Content: Logical   Suicidal Thoughts:  No  Homicidal Thoughts:  No  Memory:  WNL  Judgement:  Good  Insight:  Good  Psychomotor Activity:   Unable to assess  Concentration:  Concentration: Good and Attention Span: Good  Recall:  Good  Fund of Knowledge: Good  Language: Good  Assets:  Desire for Improvement Financial Resources/Insurance Housing Transportation Vocational/Educational  ADL's:  Intact  Cognition: WNL  Prognosis:  Good   Pertinent Labs 12/02/2021 CBC with differential hemoglobin 10.9, hematocrit 33.1 otherwise  normal CMP glucose 89, creatinine 1.6 all else normal Lipid panel completely normal  PCP follows labs  DIAGNOSES:    ICD-10-CM   1. Bipolar I disorder ([place])  F31.9     2. Generalized anxiety disorder  F41.1     3. Insomnia, unspecified type  G47.00     4. 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  Xanax prescribed by PCP on 04/05/2022.   I provided 25 minutes of non-face to face time during this encounter, including time spent before and after the visit in records review, medical decision making, counseling pertinent to today's visit, and charting.   I am glad to see him doing well so no change needs to be made. Congratulated him on 1.5 years of sobriety!  Continue Xanax 0.5 mg, 1 p.o. nightly per his PCP. Continue BuSpar 30 mg, 1 p.o. twice daily. Continue Gabapentin 300 mg, 3 qhs.  Continue Mirtazepine 7.5 mg, 1-2 qhs prn. Continue Seroquel 400 mg 2.5 pills po qhs.  Contine thiamine, Multivitamin, B Complex, Vit D, Fish oil. Return in 6 months.   Donnal Moat, PA-C

## 2022-05-02 ENCOUNTER — Other Ambulatory Visit: Payer: Self-pay | Admitting: Gastroenterology

## 2022-05-10 ENCOUNTER — Other Ambulatory Visit: Payer: Self-pay | Admitting: Physician Assistant

## 2022-05-23 ENCOUNTER — Encounter (HOSPITAL_COMMUNITY): Payer: Self-pay | Admitting: Gastroenterology

## 2022-05-23 NOTE — Progress Notes (Signed)
Anesthesia Chart Review   Case: [id number] Date/Time: 05/31/22 1330   Procedure: COLONOSCOPY WITH PROPOFOL   Anesthesia type: Monitor Anesthesia Care   Pre-op diagnosis: screening   [facility] / [facility]   Surgeons: Carol Ada, MD       DISCUSSION:56 y.o. former smoker with h/o HTN, scheduled for colonoscopy screening 05/31/2022 with Dr. Carol Ada.   Pt last seen by PCP 12/03/2021. Stable at this visit.   Low risk stress test 08/18/2020.   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  Clearance received from vascular surgeon which states no contraindications from a vascular standpoint.  VS: There were no vitals taken for this visit.  PROVIDERS: Cloward, Dianna Rossetti, MD is PCP    LABS:  no seen in PAT (all labs ordered are listed, but only abnormal results are displayed)  Labs Reviewed - No data to display   IMAGES: CTA Chest/Abdomen/Pelvis 08/20/2021 1. Evolving intraluminal irregular fibrofatty atherosclerotic plaque  versus wall adherent mural thrombus. The previously tracked linear  thrombus arising from the posterior wall of the more proximal  descending thoracic aorta has nearly resolved and is now only  approximately 6 mm in length and 1-2 mm in width. However, there is  a new irregular fibrofatty plaque versus wall adherent thrombus  adherent to the anterior surface of the mid descending thoracic  aorta measuring 2.0 cm in craniocaudal dimension and 0.7 x 0.8 cm in  width. This was not present on the prior examination from August.  2. No significant arterial abnormality within the abdomen or pelvis  other than trace calcified atherosclerotic plaque.  3. No acute abnormality within the chest, abdomen or pelvis.  4. Incidental findings as above.   EKG:   CV: Echo 06/22/2020 SUMMARY  technically limited study  normal chamber sizes  normal LV function  no valvular or doppler abnormalities  no effusion.  Past  Medical History:  Diagnosis Date   Acute sinus infection    Anemia    Anxiety    Back pain    RECURRENT   Depression    Difficulty sleeping    GERD (gastroesophageal reflux disease)    Hemorrhoids    INTERNAL/EXTERNAL   Hyperlipidemia    Hypertension    HX OF TAKING BP MED IN PAST   Increased thirst    Lightheadedness     Past Surgical History:  Procedure Laterality Date   ANTERIOR CERVICAL DECOMP/DISCECTOMY FUSION     ELBOW ARTHROSCOPY     KNEE ARTHROSCOPY     SHOULDER ARTHROSCOPY     TRANSANAL HEMORRHOIDAL DEARTERIALIZATION N/A 04/12/2013   Procedure: TRANSANAL HEMORRHOIDAL DEARTERIALIZATION ligation;  Surgeon: Adin Hector, MD;  Location: [facility];  Service: General;  Laterality: N/A;   VASECTOMY      MEDICATIONS: No current facility-administered medications for this encounter.    acetaminophen (TYLENOL) 325 MG tablet   ALPRAZolam (XANAX) 0.5 MG tablet   busPIRone (BUSPAR) 30 MG tablet   dexlansoprazole (DEXILANT) 60 MG capsule   fexofenadine (ALLEGRA) 180 MG tablet   gabapentin (NEURONTIN) 300 MG capsule   Lidocaine-Hydrocortisone Ace (ANAMANTLE HC) 3-2.5 % KIT   losartan-hydrochlorothiazide (HYZAAR) 100-12.5 MG tablet   mirtazapine (REMERON) 7.5 MG tablet   oxyCODONE (OXY IR/ROXICODONE) 5 MG immediate release tablet   pseudoephedrine (SUDAFED) 120 MG 12 hr tablet   QUEtiapine (SEROQUEL) 400 MG tablet   thiamine 100 MG tablet    [facility] Ward, PA-C [facility]Surgical [facility] [phone]

## 2022-05-24 ENCOUNTER — Encounter (HOSPITAL_COMMUNITY): Payer: Self-pay | Admitting: Gastroenterology

## 2022-05-24 NOTE — Progress Notes (Signed)
Attempted to obtain medical history via telephone, unable to reach at this time. HIPAA compliant voicemail message left requesting return call to pre surgical testing department. 

## 2022-05-31 ENCOUNTER — Ambulatory Visit (HOSPITAL_BASED_OUTPATIENT_CLINIC_OR_DEPARTMENT_OTHER): Payer: 59 | Admitting: Physician Assistant

## 2022-05-31 ENCOUNTER — Encounter (HOSPITAL_COMMUNITY): Payer: Self-pay | Admitting: Gastroenterology

## 2022-05-31 ENCOUNTER — Other Ambulatory Visit: Payer: Self-pay

## 2022-05-31 ENCOUNTER — Encounter (HOSPITAL_COMMUNITY)
Admission: RE | Disposition: A | Discharge status: Home / Self Care | Payer: Self-pay | Source: Home / Self Care | Attending: Gastroenterology

## 2022-05-31 ENCOUNTER — Ambulatory Visit (HOSPITAL_COMMUNITY): Payer: 59 | Admitting: Physician Assistant

## 2022-05-31 ENCOUNTER — Ambulatory Visit (HOSPITAL_COMMUNITY)
Admission: RE | Admit: 2022-05-31 | Discharge: 2022-05-31 | Disposition: A | Discharge status: Home / Self Care | Payer: 59 | Attending: Gastroenterology | Admitting: Gastroenterology

## 2022-05-31 DIAGNOSIS — I10 Essential (primary) hypertension: Secondary | ICD-10-CM

## 2022-05-31 DIAGNOSIS — D649 Anemia, unspecified: Secondary | ICD-10-CM

## 2022-05-31 DIAGNOSIS — D175 Benign lipomatous neoplasm of intra-abdominal organs: Secondary | ICD-10-CM | POA: Diagnosis not present

## 2022-05-31 DIAGNOSIS — F419 Anxiety disorder, unspecified: Secondary | ICD-10-CM | POA: Insufficient documentation

## 2022-05-31 DIAGNOSIS — K219 Gastro-esophageal reflux disease without esophagitis: Secondary | ICD-10-CM | POA: Diagnosis not present

## 2022-05-31 DIAGNOSIS — Z87891 Personal history of nicotine dependence: Secondary | ICD-10-CM | POA: Insufficient documentation

## 2022-05-31 DIAGNOSIS — D123 Benign neoplasm of transverse colon: Secondary | ICD-10-CM | POA: Insufficient documentation

## 2022-05-31 DIAGNOSIS — D125 Benign neoplasm of sigmoid colon: Secondary | ICD-10-CM | POA: Diagnosis not present

## 2022-05-31 DIAGNOSIS — Z1211 Encounter for screening for malignant neoplasm of colon: Secondary | ICD-10-CM

## 2022-05-31 DIAGNOSIS — F32A Depression, unspecified: Secondary | ICD-10-CM | POA: Insufficient documentation

## 2022-05-31 HISTORY — PX: COLONOSCOPY WITH PROPOFOL: SHX5780

## 2022-05-31 HISTORY — PX: POLYPECTOMY: SHX5525

## 2022-05-31 SURGERY — COLONOSCOPY WITH PROPOFOL
Anesthesia: Monitor Anesthesia Care

## 2022-05-31 MED ORDER — PROPOFOL 10 MG/ML IV BOLUS
INTRAVENOUS | Status: DC | PRN
  Administered 2022-05-31 (×3): 30 mg via INTRAVENOUS

## 2022-05-31 MED ORDER — SODIUM CHLORIDE 0.9 % IV SOLN: INTRAVENOUS | Status: DC

## 2022-05-31 MED ORDER — LACTATED RINGERS IV SOLN: INTRAVENOUS | Status: DC

## 2022-05-31 MED ORDER — PROPOFOL 500 MG/50ML IV EMUL
INTRAVENOUS | Status: DC | PRN
  Administered 2022-05-31: 150 ug/kg/min via INTRAVENOUS

## 2022-05-31 SURGICAL SUPPLY — 22 items

## 2022-05-31 NOTE — Anesthesia Postprocedure Evaluation (Signed)
Anesthesia Post Note  Patient: Guy Mayer  Procedure(s) Performed: COLONOSCOPY WITH PROPOFOL     Patient location during evaluation: PACU Anesthesia Type: MAC Level of consciousness: awake Pain management: pain level controlled Vital Signs Assessment: post-procedure vital signs reviewed and stable Respiratory status: spontaneous breathing, nonlabored ventilation and respiratory function stable Cardiovascular status: stable and blood pressure returned to baseline Postop Assessment: no apparent nausea or vomiting Anesthetic complications: no   No notable events documented.  Last Vitals:  Vitals:   05/31/22 1352 05/31/22 1402  BP: 116/71 124/83  Pulse: 80 79  Resp: 20 14  Temp:    SpO2: 92% 99%    Last Pain:  Vitals:   05/31/22 1402  TempSrc:   PainSc: 0-No pain                 Nilda Simmer

## 2022-05-31 NOTE — Transfer of Care (Signed)
Immediate Anesthesia Transfer of Care Note  Patient: Guy Mayer  Procedure(s) Performed: COLONOSCOPY WITH PROPOFOL  Patient Location: Endoscopy Unit  Anesthesia Type:MAC  Level of Consciousness: awake and patient cooperative  Airway & Oxygen Therapy: Patient Spontanous Breathing and Patient connected to face mask  Post-op Assessment: Report given to RN and Post -op Vital signs reviewed and stable  Post vital signs: Reviewed and stable  Last Vitals:  Vitals Value Taken Time  BP    Temp    Pulse 91 05/31/22 1343  Resp 0 05/31/22 1343  SpO2 100 % 05/31/22 1343  Vitals shown include unvalidated device data.  Last Pain:  Vitals:   05/31/22 1249  TempSrc: Temporal  PainSc: 0-No pain         Complications: No notable events documented.

## 2022-05-31 NOTE — H&P (Signed)
Tama Headings HPI: The patient is here for a screening colonoscopy.  He had a colonoscopy in 2016 with findings of an adenoma and SSA.  Both were small.  Past Medical History:  Diagnosis Date   Acute sinus infection    Anemia    Anxiety    Back pain    RECURRENT   Depression    Difficulty sleeping    GERD (gastroesophageal reflux disease)    Hemorrhoids    INTERNAL/EXTERNAL   Hyperlipidemia    Hypertension    HX OF TAKING BP MED IN PAST   Increased thirst    Lightheadedness     Past Surgical History:  Procedure Laterality Date   ANTERIOR CERVICAL DECOMP/DISCECTOMY FUSION     ELBOW ARTHROSCOPY     KNEE ARTHROSCOPY     SHOULDER ARTHROSCOPY     TRANSANAL HEMORRHOIDAL DEARTERIALIZATION N/A 04/12/2013   Procedure: TRANSANAL HEMORRHOIDAL DEARTERIALIZATION ligation;  Surgeon: Adin Hector, MD;  Location: [facility];  Service: General;  Laterality: N/A;   VASECTOMY      History reviewed. No pertinent family history.  Social History:  reports that he quit smoking about 3 years ago. He has never used smokeless tobacco. He reports that he does not currently use alcohol. He reports that he does not use drugs.  Allergies:  Allergies  Allergen Reactions   Meloxicam Shortness Of Breath   Codeine Itching    Medications: Scheduled: Continuous:  sodium chloride     lactated ringers 10 mL/hr at 05/31/22 1253    No results found for this or any previous visit (from the past 24 hour(s)).   No results found.  ROS:  As stated above in the HPI otherwise negative.  Blood pressure (!) 146/97, pulse (!) 104, temperature 97.9 F (36.6 C), temperature source Temporal, resp. rate 16, height '5\' 10"'$  (1.778 m), weight 83.9 kg, SpO2 100 %.    PE: Gen: NAD, Alert and Oriented HEENT:  [place]/AT, EOMI Neck: Supple, no LAD Lungs: CTA Bilaterally CV: RRR without M/G/R ABD: Soft, NTND, +BS Ext: No C/C/E  Assessment/Plan: 1) Screening colonoscopy.  Kacey Vicuna D 05/31/2022, 12:59 PM

## 2022-05-31 NOTE — Anesthesia Preprocedure Evaluation (Addendum)
Anesthesia Evaluation  Patient identified by MRN, date of birth, ID band Patient awake    Reviewed: Allergy & Precautions, NPO status , Patient's Chart, lab work & pertinent test results  History of Anesthesia Complications Negative for: history of anesthetic complications  Airway Mallampati: I  TM Distance: >3 FB Neck ROM: Full    Dental no notable dental hx.    Pulmonary neg shortness of breath, neg sleep apnea, neg COPD, neg recent URI, former smoker   Pulmonary exam normal        Cardiovascular hypertension, Pt. on medications (-) angina (-) Past MI, (-) Cardiac Stents and (-) CABG (-) dysrhythmias  Rhythm:Regular Rate:Normal  HLD, aortic thrombus being followed by vascular surgery   Neuro/Psych  PSYCHIATRIC DISORDERS Anxiety Depression    negative neurological ROS     GI/Hepatic Neg liver ROS,GERD  ,,  Endo/Other  negative endocrine ROS    Renal/GU negative Renal ROS     Musculoskeletal   Abdominal   Peds  Hematology  (+) Blood dyscrasia, anemia   Anesthesia Other Findings   Reproductive/Obstetrics                             Anesthesia Physical Anesthesia Plan  ASA: 2  Anesthesia Plan: MAC   Post-op Pain Management:    Induction: Intravenous  PONV Risk Score and Plan: 1 and Propofol infusion  Airway Management Planned: Natural Airway and Nasal Cannula  Additional Equipment:   Intra-op Plan:   Post-operative Plan:   Informed Consent: I have reviewed the patients History and Physical, chart, labs and discussed the procedure including the risks, benefits and alternatives for the proposed anesthesia with the patient or authorized representative who has indicated his/her understanding and acceptance.     Dental advisory given  Plan Discussed with: CRNA and Anesthesiologist  Anesthesia Plan Comments: (Discussed with patient risks of MAC including, but not limited to,  minor pain or discomfort, hearing people in the room, and possible need for backup general anesthesia. Risks for general anesthesia also discussed including, but not limited to, sore throat, hoarse voice, chipped/damaged teeth, injury to vocal cords, nausea and vomiting, allergic reactions, lung infection, heart attack, stroke, and death. All questions answered. )        Anesthesia Quick Evaluation

## 2022-05-31 NOTE — Discharge Instructions (Signed)

## 2022-05-31 NOTE — Anesthesia Procedure Notes (Signed)
Procedure Name: MAC Date/Time: 05/31/2022 1:04 PM  Performed by: Claudia Desanctis, CRNAPre-anesthesia Checklist: Patient identified, Emergency Drugs available, Suction available and Patient being monitored Patient Re-evaluated:Patient Re-evaluated prior to induction Oxygen Delivery Method: Simple face mask

## 2022-05-31 NOTE — Op Note (Signed)
[facility] Patient Name: Guy Mayer Procedure Date: 05/31/2022 MRN: [id number] Attending MD: Carol Ada , MD, [id number] Date of Birth: July 03, 1966 CSN: [id number] Age: 56 Admit Type: Inpatient Procedure:                Colonoscopy Indications:              Screening for colorectal malignant neoplasm Providers:                Carol Ada, MD, Burtis Junes, RN, William Dalton,                            Technician Referring MD:              Medicines:                Propofol per Anesthesia Complications:            No immediate complications. Estimated Blood Loss:     Estimated blood loss: none. Procedure:                Pre-Anesthesia Assessment:                           - Prior to the procedure, a History and Physical                            was performed, and patient medications and                            allergies were reviewed. The patient's tolerance of                            previous anesthesia was also reviewed. The risks                            and benefits of the procedure and the sedation                            options and risks were discussed with the patient.                            All questions were answered, and informed consent                            was obtained. Prior Anticoagulants: The patient has                            taken no anticoagulant or antiplatelet agents. ASA                            Grade Assessment: III - A patient with severe                            systemic disease. After reviewing the risks and  benefits, the patient was deemed in satisfactory                            condition to undergo the procedure.                           - Sedation was administered by an anesthesia                            professional. Deep sedation was attained.                           After obtaining informed consent, the colonoscope                            was passed under direct  vision. Throughout the                            procedure, the patient's blood pressure, pulse, and                            oxygen saturations were monitored continuously. The                            CF-HQ190L ([id number] Olympus colonoscope was                            introduced through the anus and advanced to the the                            cecum, identified by appendiceal orifice and                            ileocecal valve. The colonoscopy was somewhat                            difficult due to significant looping. Successful                            completion of the procedure was aided by using                            manual pressure and straightening and shortening                            the scope to obtain bowel loop reduction. The                            patient tolerated the procedure well. The quality                            of the bowel preparation was evaluated using the  BBPS [facility] Bowel Preparation Scale) with scores                            of: Right Colon = 3 (entire mucosa seen well with                            no residual staining, small fragments of stool or                            opaque liquid), Transverse Colon = 3 (entire mucosa                            seen well with no residual staining, small                            fragments of stool or opaque liquid) and Left Colon                            = 3 (entire mucosa seen well with no residual                            staining, small fragments of stool or opaque                            liquid). The total BBPS score equals 9. The quality                            of the bowel preparation was good. The ileocecal                            valve, appendiceal orifice, and rectum were                            photographed. Scope In: 1:15:06 PM Scope Out: 1:38:03 PM Scope Withdrawal Time: 0 hours 15 minutes 45 seconds  Total Procedure Duration:  0 hours 22 minutes 57 seconds  Findings:      A 3 mm polyp was found in the transverse colon. The polyp was sessile.       The polyp was removed with a cold snare. Resection and retrieval were       complete.      There was a medium-sized lipoma, in the ascending colon. Impression:               - One 3 mm polyp in the transverse colon, removed                            with a cold snare. Resected and retrieved.                           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 Moderate Sedation:      Not Applicable - Patient had care per Anesthesia. Recommendation:           - Patient has a contact number available for  emergencies. The signs and symptoms of potential                            delayed complications were discussed with the                            patient. Return to normal activities tomorrow.                            Written discharge instructions were provided to the                            patient.                           - Resume previous diet.                           - Continue present medications.                           - Await pathology results.                           - Repeat colonoscopy in 10 years for surveillance. Procedure Code(s):        --- Professional ---                           [phone], Colonoscopy, flexible; with removal of                            tumor(s), polyp(s), or other lesion(s) by snare                            technique Diagnosis Code(s):        --- Professional ---                           Z12.11, Encounter for screening for malignant                            neoplasm of colon                           D12.3, Benign neoplasm of transverse colon (hepatic                            flexure or splenic flexure)                           D17.5, Benign lipomatous neoplasm of                            intra-abdominal organs CPT copyright 2022 [employer]. All rights  reserved. The codes documented in this report are preliminary and upon coder review may  be revised to meet current compliance requirements. Carol Ada, MD Carol Ada, MD  05/31/2022 1:47:29 PM This report has been signed electronically. Number of Addenda: 0

## 2022-06-02 LAB — SURGICAL PATHOLOGY

## 2022-06-03 ENCOUNTER — Encounter (HOSPITAL_COMMUNITY): Payer: Self-pay | Admitting: Gastroenterology

## 2022-08-08 ENCOUNTER — Other Ambulatory Visit: Payer: Self-pay | Admitting: Physician Assistant

## 2022-10-28 ENCOUNTER — Ambulatory Visit (INDEPENDENT_AMBULATORY_CARE_PROVIDER_SITE_OTHER): Payer: 59 | Admitting: Physician Assistant

## 2022-10-28 ENCOUNTER — Encounter: Payer: Self-pay | Admitting: Physician Assistant

## 2022-10-28 DIAGNOSIS — G4700 Insomnia, unspecified: Secondary | ICD-10-CM | POA: Diagnosis not present

## 2022-10-28 DIAGNOSIS — F1021 Alcohol dependence, in remission: Secondary | ICD-10-CM

## 2022-10-28 DIAGNOSIS — F319 Bipolar disorder, unspecified: Secondary | ICD-10-CM | POA: Diagnosis not present

## 2022-10-28 DIAGNOSIS — F411 Generalized anxiety disorder: Secondary | ICD-10-CM

## 2022-10-28 MED ORDER — BUSPIRONE HCL 30 MG PO TABS: 30.0000 mg | ORAL_TABLET | Freq: Two times a day (BID) | ORAL | 1 refills | Status: DC

## 2022-10-28 MED ORDER — QUETIAPINE FUMARATE 400 MG PO TABS: ORAL_TABLET | ORAL | 1 refills | Status: DC

## 2022-10-28 MED ORDER — MIRTAZAPINE 7.5 MG PO TABS: ORAL_TABLET | ORAL | 1 refills | Status: DC

## 2022-10-28 MED ORDER — GABAPENTIN 300 MG PO CAPS: 900.0000 mg | ORAL_CAPSULE | Freq: Every day | ORAL | 1 refills | Status: DC

## 2022-10-28 NOTE — Progress Notes (Signed)
[facility]  Patient ID: FEDERICK LEVENE,  MRN: [id number]  PCP: Lavell Islam, MD  Date of Evaluation: 10/28/2022 time spent:25 minutes  Chief Complaint:  Chief Complaint   Depression; Anxiety; Insomnia; Follow-up    Virtual Visit via Telehealth  I connected with patient by telephone, with their informed consent, and verified patient privacy and that I am speaking with the correct person using two identifiers.  I am private, in my office and the patient is at work.  I discussed the limitations, risks, security and privacy concerns of performing an evaluation and management service by telephone and the availability of in person appointments. I also discussed with the patient that there may be a patient responsible charge related to this service. The patient expressed understanding and agreed to proceed.   I discussed the assessment and treatment plan with the patient. The patient was provided an opportunity to ask questions and all were answered. The patient agreed with the plan and demonstrated an understanding of the instructions.   The patient was advised to call back or seek an in-person evaluation if the symptoms worsen or if the condition fails to improve as anticipated.  I provided 25 minutes of non-face-to-face time during this encounter.  HISTORY/CURRENT STATUS: HPI routine 50-month med check.  As far as his mental health goes, he is doing well.  Was in the hospital though with life-threatening DVT/PE but is doing well now and back at work.  Occurred in January.  Patient is able to enjoy things.  Energy and motivation are good.  No extreme sadness, tearfulness, or feelings of hopelessness.  Sleeps well most of the time. ADLs and personal hygiene are normal.   Denies any changes in concentration, making decisions, or remembering things.  Appetite has not changed.  Weight is stable.  No reports of extreme anxiety.  No panic attacks.  Denies suicidal or homicidal  thoughts.  Patient denies increased energy with decreased need for sleep, increased talkativeness, racing thoughts, impulsivity or risky behaviors, increased spending, increased libido, grandiosity, increased irritability or anger, paranoia, or hallucinations.  Denies dizziness, syncope, seizures, numbness, tingling, tremor, tics, unsteady gait, slurred speech, confusion. Denies muscle or joint pain, stiffness, or dystonia. Denies unexplained weight loss, frequent infections, or sores that heal slowly.  No polyphagia, polydipsia, or polyuria. Denies visual changes or paresthesias.   Individual Medical History/ Review of Systems: Changes? :Yes     Had DVT and PE, hospitalized, had surgery to remove as much of the clot as possible.  States he is doing well and back at work now.  He had an allergic reaction to something, after going home from the hospital he had a rash and itched to death, he finally stopped all the meds except his psych meds that he took before this illness, even the blood thinner. PCP gave him steroid, and he's better now. He plans to reintroduce the medications 1 at a time, to try and figure out what caused the allergy.  Past medications for mental health diagnoses include: Elavil, Xanax, Celexa, Seroquel, Wellbutrin, Ambien, Depakote, Lamictal, Mirtazepine, Trazodone  Allergies: Meloxicam and Codeine  Current Medications:  Current Outpatient Medications:    ALPRAZolam (XANAX) 0.5 MG tablet, Take 0.5 mg by mouth at bedtime., Disp: , Rfl:    albuterol (VENTOLIN HFA) 108 (90 Base) MCG/ACT inhaler, Inhale 2 puffs into the lungs every 6 (six) hours as needed for wheezing or shortness of breath. (Patient not taking: Reported on 10/28/2022), Disp: , Rfl:  busPIRone (BUSPAR) 30 MG tablet, Take 1 tablet (30 mg total) by mouth 2 (two) times daily., Disp: 180 tablet, Rfl: 1   Cyanocobalamin (B-12 PO), Take 1 capsule by mouth daily. (Patient not taking: Reported on 10/28/2022), Disp: , Rfl:     dexlansoprazole (DEXILANT) 60 MG capsule, Take 60 mg by mouth daily. (Patient not taking: Reported on 10/28/2022), Disp: , Rfl:    gabapentin (NEURONTIN) 300 MG capsule, Take 3 capsules (900 mg total) by mouth at bedtime., Disp: 270 capsule, Rfl: 1   losartan-hydrochlorothiazide (HYZAAR) 50-12.5 MG tablet, Take 1 tablet by mouth daily. (Patient not taking: Reported on 10/28/2022), Disp: , Rfl:    mirtazapine (REMERON) 7.5 MG tablet, TAKE 1-2 TABLETS BY MOUTH AT BEDTIME AS NEEDED (SLEEP)., Disp: 180 tablet, Rfl: 1   potassium chloride (KLOR-CON) 10 MEQ tablet, Take 10 mEq by mouth 2 (two) times daily. (Patient not taking: Reported on 10/28/2022), Disp: , Rfl:    QUEtiapine (SEROQUEL) 400 MG tablet, 2.5 po qhs, Disp: 225 tablet, Rfl: 1   sildenafil (VIAGRA) 100 MG tablet, Take 100 mg by mouth as needed for erectile dysfunction. (Patient not taking: Reported on 10/28/2022), Disp: , Rfl:    thiamine 100 MG tablet, Take 1 tablet (100 mg total) by mouth daily. (Patient not taking: Reported on 05/30/2022), Disp: 30 tablet, Rfl: 1 Medication Side Effects: none  Family Medical/ Social History: Changes?  no  MENTAL HEALTH EXAM:  There were no vitals taken for this visit.There is no height or weight on file to calculate BMI.  General Appearance:  Unable to assess  Eye Contact:   Unable to assess  Speech:  Clear and Coherent  Volume:  Normal  Mood:  Euthymic  Affect:   Unable to assess  Thought Process:  Goal Directed and Descriptions of Associations: Circumstantial  Orientation:  Full (Time, Place, and Person)  Thought Content: Logical   Suicidal Thoughts:  No  Homicidal Thoughts:  No  Memory:  WNL  Judgement:  Good  Insight:  Good  Psychomotor Activity:   Unable to assess  Concentration:  Concentration: Good and Attention Span: Good  Recall:  Good  Fund of Knowledge: Good  Language: Good  Assets:  Communication Skills Desire for Improvement Financial  Resources/Insurance Housing Transportation Vocational/Educational  ADL's:  Intact  Cognition: WNL  Prognosis:  Good   PCP follows labs  DIAGNOSES:    ICD-10-CM   1. Bipolar I disorder (HCC)  F31.9     2. Generalized anxiety disorder  F41.1     3. Insomnia, unspecified type  G47.00     4. 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  Xanax prescribed by PCP on 10/13/2022.  Gabapentin filled for 08/23/2022 written by me.  He needs to call the pulmonologist or cardiologist concerning the blood thinner since he is not taking anything right now.  He understands the risk of new clot formation but states he had to do something. From a mental health standpoint he is doing well so no changes will be made.  Continue Xanax 0.5 mg, 1 p.o. nightly per his PCP. Continue BuSpar 30 mg, 1 p.o. twice daily. Continue Gabapentin 300 mg, 3 qhs.  Continue Mirtazepine 7.5 mg, 1-2 qhs prn. Continue Seroquel 400 mg 2.5 pills po qhs.  Contine thiamine, Multivitamin, B Complex, Vit D, Fish oil. Return in 6 months.   Melony Overly, PA-C

## 2023-04-23 ENCOUNTER — Other Ambulatory Visit: Payer: Self-pay | Admitting: Physician Assistant

## 2023-04-23 NOTE — Telephone Encounter (Signed)
Appt 10/24

## 2023-04-25 ENCOUNTER — Other Ambulatory Visit: Payer: Self-pay | Admitting: Physician Assistant

## 2023-04-25 NOTE — Telephone Encounter (Signed)
Appt 10/24

## 2023-04-27 ENCOUNTER — Encounter: Payer: Self-pay | Admitting: Physician Assistant

## 2023-04-27 ENCOUNTER — Ambulatory Visit: Payer: 59 | Admitting: Physician Assistant

## 2023-04-27 DIAGNOSIS — G4700 Insomnia, unspecified: Secondary | ICD-10-CM

## 2023-04-27 DIAGNOSIS — F1021 Alcohol dependence, in remission: Secondary | ICD-10-CM | POA: Diagnosis not present

## 2023-04-27 DIAGNOSIS — F319 Bipolar disorder, unspecified: Secondary | ICD-10-CM | POA: Diagnosis not present

## 2023-04-27 MED ORDER — QUETIAPINE FUMARATE 400 MG PO TABS: ORAL_TABLET | ORAL | 1 refills | Status: DC

## 2023-04-27 MED ORDER — BUSPIRONE HCL 30 MG PO TABS: 30.0000 mg | ORAL_TABLET | Freq: Two times a day (BID) | ORAL | 1 refills | Status: DC

## 2023-04-27 MED ORDER — GABAPENTIN 300 MG PO CAPS: 900.0000 mg | ORAL_CAPSULE | Freq: Every day | ORAL | 1 refills | Status: DC

## 2023-04-27 NOTE — Progress Notes (Signed)
[facility]  Patient ID: Guy Mayer,  MRN: [id number]  PCP: Lavell Islam, MD  Date of Evaluation: 04/27/2023 Time spent: 26 minutes   Chief Complaint:  Chief Complaint   Follow-up    Virtual Visit via Telehealth  I connected with patient by telephone, with their informed consent, and verified patient privacy and that I am speaking with the correct person using two identifiers.  I am private, in my office and the patient is at work.  I discussed the limitations, risks, security and privacy concerns of performing an evaluation and management service by telephone and the availability of in person appointments. I also discussed with the patient that there may be a patient responsible charge related to this service. The patient expressed understanding and agreed to proceed.   I discussed the assessment and treatment plan with the patient. The patient was provided an opportunity to ask questions and all were answered. The patient agreed with the plan and demonstrated an understanding of the instructions.   The patient was advised to call back or seek an in-person evaluation if the symptoms worsen or if the condition fails to improve as anticipated.  I provided 26 minutes of non-face-to-face time during this encounter.  HISTORY/CURRENT STATUS: HPI routine 33-month med check.  Guy Mayer is doing well for the most part.  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  He does not feel like that will be a problem though.  Patient is able to enjoy things.  Energy and motivation are good.  No extreme sadness, tearfulness, or feelings of hopelessness.  Sleeps well most of the time. ADLs and personal hygiene are normal.   Denies any changes in concentration, making decisions, or remembering things.  Appetite has not changed.  Weight is stable.  No panic attacks, he does get overwhelmed at times but the BuSpar helps to prevent that.  Denies cutting or any  form of self-harm.  Denies suicidal or homicidal thoughts.  Patient denies increased energy with decreased need for sleep, increased talkativeness, racing thoughts, impulsivity or risky behaviors, increased spending, increased libido, grandiosity, increased irritability or anger, paranoia, or hallucinations.  Denies dizziness, syncope, seizures, numbness, tingling, tremor, tics, unsteady gait, slurred speech, confusion. Denies muscle or joint pain, stiffness, or dystonia. Denies unexplained weight loss, frequent infections, or sores that heal slowly.  No polyphagia, polydipsia, or polyuria. Denies visual changes or paresthesias.   Individual Medical History/ Review of Systems: Changes? :No    still under the care of his PCP for blood clots, he has seen hematology, states no one can figure out why he has been having the blood clots.  He has not had any recently.  Past medications for mental health diagnoses include: Elavil, Xanax, Celexa, Seroquel, Wellbutrin, Ambien, Depakote, Lamictal, Mirtazepine, Trazodone  Allergies: Meloxicam and Codeine  Current Medications:  Current Outpatient Medications:    ALPRAZolam (XANAX) 0.5 MG tablet, Take 0.5 mg by mouth at bedtime., Disp: , Rfl:    mirtazapine (REMERON) 7.5 MG tablet, TAKE 1-2 TABLETS BY MOUTH AT BEDTIME AS NEEDED (SLEEP)., Disp: 180 tablet, Rfl: 1   albuterol (VENTOLIN HFA) 108 (90 Base) MCG/ACT inhaler, Inhale 2 puffs into the lungs every 6 (six) hours as needed for wheezing or shortness of breath. (Patient not taking: Reported on 10/28/2022), Disp: , Rfl:    busPIRone (BUSPAR) 30 MG tablet, Take 1 tablet (30 mg total) by mouth 2 (two) times daily., Disp: 180 tablet, Rfl: 1  Cyanocobalamin (B-12 PO), Take 1 capsule by mouth daily. (Patient not taking: Reported on 10/28/2022), Disp: , Rfl:    dexlansoprazole (DEXILANT) 60 MG capsule, Take 60 mg by mouth daily. (Patient not taking: Reported on 10/28/2022), Disp: , Rfl:    gabapentin (NEURONTIN)  300 MG capsule, Take 3 capsules (900 mg total) by mouth at bedtime., Disp: 270 capsule, Rfl: 1   losartan-hydrochlorothiazide (HYZAAR) 50-12.5 MG tablet, Take 1 tablet by mouth daily. (Patient not taking: Reported on 10/28/2022), Disp: , Rfl:    potassium chloride (KLOR-CON) 10 MEQ tablet, Take 10 mEq by mouth 2 (two) times daily. (Patient not taking: Reported on 10/28/2022), Disp: , Rfl:    QUEtiapine (SEROQUEL) 400 MG tablet, 2.5 po qhs, Disp: 225 tablet, Rfl: 1   sildenafil (VIAGRA) 100 MG tablet, Take 100 mg by mouth as needed for erectile dysfunction. (Patient not taking: Reported on 10/28/2022), Disp: , Rfl:    thiamine 100 MG tablet, Take 1 tablet (100 mg total) by mouth daily. (Patient not taking: Reported on 05/30/2022), Disp: 30 tablet, Rfl: 1 Medication Side Effects: none  Family Medical/ Social History: Changes?  no  MENTAL HEALTH EXAM:  There were no vitals taken for this visit.There is no height or weight on file to calculate BMI.  General Appearance:  Unable to assess  Eye Contact:   Unable to assess  Speech:  Clear and Coherent  Volume:  Normal  Mood:  Euthymic  Affect:   Unable to assess  Thought Process:  Goal Directed and Descriptions of Associations: Circumstantial  Orientation:  Full (Time, Place, and Person)  Thought Content: Logical   Suicidal Thoughts:  No  Homicidal Thoughts:  No  Memory:  WNL  Judgement:  Good  Insight:  Good  Psychomotor Activity:   Unable to assess  Concentration:  Concentration: Good and Attention Span: Good  Recall:  Good  Fund of Knowledge: Good  Language: Good  Assets:  Communication Skills Desire for Improvement Financial Resources/Insurance Housing Resilience Transportation Vocational/Educational  ADL's:  Intact  Cognition: WNL  Prognosis:  Good   PCP follows labs  DIAGNOSES:    ICD-10-CM   1. Bipolar I disorder (HCC)  F31.9     2. Insomnia, unspecified type  G47.00     3. 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  Xanax prescribed by PCP on 04/10/2023.  Gabapentin filled for 03/26/2023.  I provided 26 minutes of non-face-to-face time during this encounter, including time spent before and after the visit in records review, medical decision making, counseling pertinent to today's visit, and charting.   He is stable as far as his mental health medications go so no changes will be made.  Continue Xanax 0.5 mg, 1 p.o. nightly per his PCP. Continue BuSpar 30 mg, 1 p.o. twice daily. Continue Gabapentin 300 mg, 3 qhs.  Continue Mirtazepine 7.5 mg, 1-2 qhs prn. Continue Seroquel 400 mg 2.5 pills po qhs.  Contine thiamine, Multivitamin, B Complex, Vit D, Fish oil. Return in 6 months.   Melony Overly, PA-C

## 2023-08-04 ENCOUNTER — Other Ambulatory Visit: Payer: Self-pay | Admitting: Physician Assistant

## 2023-10-13 ENCOUNTER — Other Ambulatory Visit: Payer: Self-pay | Admitting: Physician Assistant

## 2023-10-25 ENCOUNTER — Ambulatory Visit: Payer: 59 | Admitting: Physician Assistant

## 2023-10-25 DIAGNOSIS — Z91199 Patient's noncompliance with other medical treatment and regimen due to unspecified reason: Secondary | ICD-10-CM

## 2023-10-25 NOTE — Progress Notes (Signed)
 No show

## 2023-11-07 ENCOUNTER — Other Ambulatory Visit: Payer: Self-pay | Admitting: Physician Assistant

## 2023-11-16 ENCOUNTER — Encounter: Payer: Self-pay | Admitting: Physician Assistant

## 2023-11-16 ENCOUNTER — Ambulatory Visit (INDEPENDENT_AMBULATORY_CARE_PROVIDER_SITE_OTHER): Payer: Self-pay | Admitting: Physician Assistant

## 2023-11-16 DIAGNOSIS — G4700 Insomnia, unspecified: Secondary | ICD-10-CM | POA: Diagnosis not present

## 2023-11-16 DIAGNOSIS — F411 Generalized anxiety disorder: Secondary | ICD-10-CM | POA: Diagnosis not present

## 2023-11-16 DIAGNOSIS — F319 Bipolar disorder, unspecified: Secondary | ICD-10-CM

## 2023-11-16 MED ORDER — BUSPIRONE HCL 30 MG PO TABS: 30.0000 mg | ORAL_TABLET | Freq: Two times a day (BID) | ORAL | 1 refills | Status: DC

## 2023-11-16 MED ORDER — MIRTAZAPINE 7.5 MG PO TABS
7.5000 mg | ORAL_TABLET | Freq: Every evening | ORAL | 1 refills | Status: DC | PRN
Start: 2023-11-16 — End: 2024-05-17

## 2023-11-16 MED ORDER — GABAPENTIN 300 MG PO CAPS
900.0000 mg | ORAL_CAPSULE | Freq: Every day | ORAL | 1 refills | Status: DC
Start: 2023-11-16 — End: 2024-05-17

## 2023-11-16 MED ORDER — QUETIAPINE FUMARATE 400 MG PO TABS
ORAL_TABLET | ORAL | 1 refills | Status: DC
Start: 2023-11-16 — End: 2023-11-24

## 2023-11-16 NOTE — Progress Notes (Signed)
 [facility]  Patient ID: Guy Mayer,  MRN: [id number]  PCP: Victor Grapes, MD  Date of Evaluation: 11/16/2023 Time spent:25 minutes  Chief Complaint:  Chief Complaint   Depression; Anxiety; Follow-up    Virtual Visit via Telehealth  I connected with patient by telephone, with their informed consent, and verified patient privacy and that I am speaking with the correct person using two identifiers.  I am private, in my office and the patient is at work.  I discussed the limitations, risks, security and privacy concerns of performing an evaluation and management service by telephone and the availability of in person appointments. I also discussed with the patient that there may be a patient responsible charge related to this service. The patient expressed understanding and agreed to proceed.   I discussed the assessment and treatment plan with the patient. The patient was provided an opportunity to ask questions and all were answered. The patient agreed with the plan and demonstrated an understanding of the instructions.   The patient was advised to call back or seek an in-person evaluation if the symptoms worsen or if the condition fails to improve as anticipated.  I provided 25 minutes of non-face-to-face time during this encounter.  HISTORY/CURRENT STATUS: HPI  For routine 6 month med check.   He's doing well.  His meds are working as intended.  He is working at a different [employer]. It's going fine.   Patient is able to enjoy things.  Energy and motivation are good.   No extreme sadness, tearfulness, or feelings of hopelessness.  Sleeps well.  ADLs and personal hygiene are normal.   Denies any changes in concentration, making decisions, or remembering things.  Appetite has not changed.  Weight is stable.  No PA.  Gets overwhelmed sometimes but able to work through any anxiety he has.  Continued sobriety for almost 3 years.  Denies suicidal or homicidal  thoughts.  Patient denies increased energy with decreased need for sleep, increased talkativeness, racing thoughts, impulsivity or risky behaviors, increased spending, increased libido, grandiosity, increased irritability or anger, paranoia, or hallucinations.  Denies dizziness, syncope, seizures, numbness, tingling, tremor, tics, unsteady gait, slurred speech, confusion. Denies muscle or joint pain, stiffness, or dystonia. Denies unexplained weight loss, frequent infections, or sores that heal slowly.  No polyphagia, polydipsia, or polyuria. Denies visual changes or paresthesias.   Individual Medical History/ Review of Systems: Changes? :No      Past medications for mental health diagnoses include: Elavil, Xanax , Celexa, Seroquel , Wellbutrin, Ambien , Depakote, Lamictal, Mirtazepine, Trazodone   Allergies: Meloxicam and Codeine  Current Medications:  Current Outpatient Medications:    albuterol (VENTOLIN HFA) 108 (90 Base) MCG/ACT inhaler, Inhale 2 puffs into the lungs every 6 (six) hours as needed for wheezing or shortness of breath., Disp: , Rfl:    ALPRAZolam  (XANAX ) 0.5 MG tablet, Take 0.5 mg by mouth at bedtime., Disp: , Rfl:    Cyanocobalamin (B-12 PO), Take 1 capsule by mouth daily., Disp: , Rfl:    dexlansoprazole (DEXILANT) 60 MG capsule, Take 60 mg by mouth daily., Disp: , Rfl:    losartan-hydrochlorothiazide (HYZAAR) 50-12.5 MG tablet, Take 1 tablet by mouth daily. 'sometimes', Disp: , Rfl:    potassium chloride (KLOR-CON) 10 MEQ tablet, Take 10 mEq by mouth 2 (two) times daily., Disp: , Rfl:    sildenafil (VIAGRA) 100 MG tablet, Take 100 mg by mouth as needed for erectile dysfunction., Disp: , Rfl:    thiamine  100 MG tablet, Take  1 tablet (100 mg total) by mouth daily., Disp: 30 tablet, Rfl: 1   busPIRone  (BUSPAR ) 30 MG tablet, Take 1 tablet (30 mg total) by mouth 2 (two) times daily., Disp: 180 tablet, Rfl: 1   gabapentin  (NEURONTIN ) 300 MG capsule, Take 3 capsules (900 mg total)  by mouth at bedtime., Disp: 270 capsule, Rfl: 1   mirtazapine  (REMERON ) 7.5 MG tablet, Take 1-2 tablets (7.5-15 mg total) by mouth at bedtime as needed. for sleep, Disp: 180 tablet, Rfl: 1   QUEtiapine  (SEROQUEL ) 400 MG tablet, 2.5 po qhs, Disp: 225 tablet, Rfl: 1 Medication Side Effects: none  Family Medical/ Social History: Changes?  no  MENTAL HEALTH EXAM:  There were no vitals taken for this visit.There is no height or weight on file to calculate BMI.  General Appearance: Unable to assess  Eye Contact:  Unable to assess  Speech:  Clear and Coherent  Volume:  Normal  Mood:  Euthymic  Affect:  Unable to assess  Thought Process:  Goal Directed and Descriptions of Associations: Circumstantial  Orientation:  Full (Time, Place, and Person)  Thought Content: Logical   Suicidal Thoughts:  No  Homicidal Thoughts:  No  Memory:  WNL  Judgement:  Good  Insight:  Good  Psychomotor Activity:  Unable to assess  Concentration:  Concentration: Good and Attention Span: Good  Recall:  Good  Fund of Knowledge: Good  Language: Good  Assets:  Communication Skills Desire for Improvement Financial Resources/Insurance Housing Resilience Social Support Transportation Vocational/Educational  ADL's:  Intact  Cognition: WNL  Prognosis:  Good   PCP follows labs  DIAGNOSES:    ICD-10-CM   1. Bipolar I disorder (HCC)  F31.9     2. Generalized anxiety disorder  F41.1     3. Insomnia, unspecified type  G47.00       Receiving Psychotherapy: No   RECOMMENDATIONS:  PDMP was reviewed.  Xanax  prescribed by PCP on 10/26/2023.  Gabapentin  filled 11/01/2023. I provided 25 minutes of non-face-to-face time during this encounter, including time spent before and after the visit in records review, medical decision making, counseling pertinent to today's visit, and charting.   He is stable on current meds so no changes will be made.   Continue Xanax  0.5 mg, 1 p.o. nightly per his PCP. Continue BuSpar   30 mg, 1 p.o. twice daily. Continue Gabapentin  300 mg, 3 qhs.  Continue Mirtazepine 7.5 mg, 1-2 qhs prn. Continue Seroquel  400 mg 2.5 pills po qhs.  Contine thiamine , Multivitamin, B Complex, Vit D, Fish oil. Return in 6 months.   Marvia Slocumb, PA-C

## 2023-11-17 ENCOUNTER — Other Ambulatory Visit: Payer: Self-pay | Admitting: Physician Assistant

## 2023-11-17 NOTE — Telephone Encounter (Signed)
 Do not see active insurance for pharmacy benefits

## 2023-11-23 NOTE — Telephone Encounter (Signed)
 Contacted pharmacy and they report pt is on his wife's insurance [employer] ID# [id number], BIN [id number], [phone]  PA submitted and response should be 72-120 hours.

## 2023-11-23 NOTE — Telephone Encounter (Signed)
 His insurance terminated in April per admin staff. Will contact pharmacy to check insurance.

## 2023-11-24 NOTE — Telephone Encounter (Signed)
 Prior approval received for Quetiapine  Fumarate ER 400 mg tablets #225/90 DAY 10/24/23-11/22/24 with [employer] PA# [id number].  Contacted [employer] at [phone] to clarify should be for IR instead, rep reports it is also covered but its a refill too soon. Next refill due 01/18/24.

## 2024-03-19 ENCOUNTER — Emergency Department (HOSPITAL_BASED_OUTPATIENT_CLINIC_OR_DEPARTMENT_OTHER)
Admission: EM | Admit: 2024-03-19 | Discharge: 2024-03-19 | Disposition: A | Discharge status: Home / Self Care | Attending: Emergency Medicine | Admitting: Emergency Medicine

## 2024-03-19 ENCOUNTER — Emergency Department (HOSPITAL_BASED_OUTPATIENT_CLINIC_OR_DEPARTMENT_OTHER)

## 2024-03-19 ENCOUNTER — Other Ambulatory Visit: Payer: Self-pay

## 2024-03-19 ENCOUNTER — Encounter (HOSPITAL_BASED_OUTPATIENT_CLINIC_OR_DEPARTMENT_OTHER): Payer: Self-pay | Admitting: Emergency Medicine

## 2024-03-19 DIAGNOSIS — Z79899 Other long term (current) drug therapy: Secondary | ICD-10-CM | POA: Diagnosis not present

## 2024-03-19 DIAGNOSIS — I10 Essential (primary) hypertension: Secondary | ICD-10-CM | POA: Diagnosis not present

## 2024-03-19 DIAGNOSIS — E860 Dehydration: Secondary | ICD-10-CM

## 2024-03-19 DIAGNOSIS — W19XXXA Unspecified fall, initial encounter: Secondary | ICD-10-CM | POA: Diagnosis not present

## 2024-03-19 DIAGNOSIS — R7401 Elevation of levels of liver transaminase levels: Secondary | ICD-10-CM | POA: Insufficient documentation

## 2024-03-19 DIAGNOSIS — S59911A Unspecified injury of right forearm, initial encounter: Secondary | ICD-10-CM | POA: Diagnosis present

## 2024-03-19 DIAGNOSIS — Z7901 Long term (current) use of anticoagulants: Secondary | ICD-10-CM | POA: Diagnosis not present

## 2024-03-19 DIAGNOSIS — I82409 Acute embolism and thrombosis of unspecified deep veins of unspecified lower extremity: Secondary | ICD-10-CM | POA: Insufficient documentation

## 2024-03-19 DIAGNOSIS — S5011XA Contusion of right forearm, initial encounter: Secondary | ICD-10-CM | POA: Diagnosis not present

## 2024-03-19 DIAGNOSIS — D649 Anemia, unspecified: Secondary | ICD-10-CM | POA: Insufficient documentation

## 2024-03-19 DIAGNOSIS — R4182 Altered mental status, unspecified: Secondary | ICD-10-CM | POA: Insufficient documentation

## 2024-03-19 LAB — COMPREHENSIVE METABOLIC PANEL WITH GFR
ALT: 34 U/L (ref 0–44)
AST: 79 U/L — ABNORMAL HIGH (ref 15–41)
Albumin: 4 g/dL (ref 3.5–5.0)
Alkaline Phosphatase: 102 U/L (ref 38–126)
Anion gap: 14 (ref 5–15)
BUN: 12 mg/dL (ref 6–20)
CO2: 26 mmol/L (ref 22–32)
Calcium: 8.6 mg/dL — ABNORMAL LOW (ref 8.9–10.3)
Chloride: 105 mmol/L (ref 98–111)
Creatinine, Ser: 0.89 mg/dL (ref 0.61–1.24)
GFR, Estimated: >60 mL/min (ref >60)
Glucose, Bld: 112 mg/dL — ABNORMAL HIGH (ref 70–99)
Potassium: 3.8 mmol/L (ref 3.5–5.1)
Sodium: 145 mmol/L (ref 135–145)
Total Bilirubin: 0.8 mg/dL (ref 0.0–1.2)
Total Protein: 6.5 g/dL (ref 6.5–8.1)

## 2024-03-19 LAB — URINALYSIS, ROUTINE W REFLEX MICROSCOPIC
Bilirubin Urine: NEGATIVE
Glucose, UA: NEGATIVE mg/dL
Hgb urine dipstick: NEGATIVE
Ketones, ur: NEGATIVE mg/dL
Leukocytes,Ua: NEGATIVE
Nitrite: NEGATIVE
Protein, ur: 100 mg/dL — AB
Specific Gravity, Urine: >=1.03 (ref 1.005–1.030)
pH: 7 (ref 5.0–8.0)

## 2024-03-19 LAB — CBC
HCT: 37.5 % — ABNORMAL LOW (ref 39.0–52.0)
Hemoglobin: 12.8 g/dL — ABNORMAL LOW (ref 13.0–17.0)
MCH: 33.6 pg (ref 26.0–34.0)
MCHC: 34.1 g/dL (ref 30.0–36.0)
MCV: 98.4 fL (ref 80.0–100.0)
Platelets: 345 K/uL (ref 150–400)
RBC: 3.81 MIL/uL — ABNORMAL LOW (ref 4.22–5.81)
RDW: 14.7 % (ref 11.5–15.5)
WBC: 6.3 K/uL (ref 4.0–10.5)
nRBC: 0 % (ref 0.0–0.2)

## 2024-03-19 LAB — RESP PANEL BY RT-PCR (RSV, FLU A&B, COVID)  RVPGX2
Influenza A by PCR: NEGATIVE
Influenza B by PCR: NEGATIVE
Resp Syncytial Virus by PCR: NEGATIVE
SARS Coronavirus 2 by RT PCR: NEGATIVE

## 2024-03-19 LAB — URINALYSIS, MICROSCOPIC (REFLEX)
RBC / HPF: NONE SEEN RBC/hpf (ref 0–5)
WBC, UA: NONE SEEN WBC/hpf (ref 0–5)

## 2024-03-19 LAB — CK: Total CK: 42 U/L — ABNORMAL LOW (ref 49–397)

## 2024-03-19 LAB — TROPONIN T, HIGH SENSITIVITY: Troponin T High Sensitivity: <15 ng/L (ref 0–19)

## 2024-03-19 LAB — CBG MONITORING, ED: Glucose-Capillary: 104 mg/dL — ABNORMAL HIGH (ref 70–99)

## 2024-03-19 MED ORDER — LACTATED RINGERS IV BOLUS
1000.0000 mL | Freq: Once | INTRAVENOUS | Status: AC
  Administered 2024-03-19: 1000 mL via INTRAVENOUS

## 2024-03-19 NOTE — ED Triage Notes (Signed)
 Pt with unsteady gait- reports multiple falls onto carpeted bedroom floor 2 days ago.   Takes xarelto. Spouse reports ongoing confusion since. Pt with delayed, but appropriate responses in triage.   + blurred vision, + nausea/emesis, + confusion

## 2024-03-19 NOTE — ED Notes (Signed)
 Pt assisted with difficult ambulation. Reports 8/10 dizziness.

## 2024-03-19 NOTE — ED Provider Notes (Signed)
 [facility] Provider Note   CSN: [id number] Arrival date & time: 03/19/24  1437     Patient presents with: Fall and Altered Mental Status   Guy Mayer is a 58 y.o. 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  He states he is not sure what led up to the fall and does not really remember the fall.  His wife found him on the floor not long after he fell and was able to help him up.  Since then, he reports some confusion as well as 2 episodes of emesis.  He reports he has been having pain in the left side of the chest intermittently for the past couple of weeks.  This is also associated with shortness of breath.  He denies any pain with exertion, pleuritic chest pain, pain that radiates to the back.  No history of seizures.  He denies pain anywhere in his body.  Wife at bedside reports the falls and minor confusion has been a chronic problem for the patient and this is not something new.     Fall Pertinent negatives include no headaches.  Altered Mental Status Associated symptoms: no headaches        Prior to Admission medications   Medication Sig Start Date End Date Taking? Authorizing Provider  albuterol (VENTOLIN HFA) 108 (90 Base) MCG/ACT inhaler Inhale 2 puffs into the lungs every 6 (six) hours as needed for wheezing or shortness of breath. 04/08/13   [provider]  ALPRAZolam  (XANAX ) 0.5 MG tablet Take 0.5 mg by mouth at bedtime. 04/07/21   [provider]  busPIRone  (BUSPAR ) 30 MG tablet Take 1 tablet (30 mg total) by mouth 2 (two) times daily. 11/16/23   Rhys Boyer T, PA-C  Cyanocobalamin (B-12 PO) Take 1 capsule by mouth daily.    [provider]  dexlansoprazole (DEXILANT) 60 MG capsule Take 60 mg by mouth daily.    [provider]  gabapentin  (NEURONTIN ) 300 MG capsule Take 3 capsules (900 mg  total) by mouth at bedtime. 11/16/23   Rhys Boyer DASEN, PA-C  losartan-hydrochlorothiazide (HYZAAR) 50-12.5 MG tablet Take 1 tablet by mouth daily. 'sometimes'    [provider]  mirtazapine  (REMERON ) 7.5 MG tablet Take 1-2 tablets (7.5-15 mg total) by mouth at bedtime as needed. for sleep 11/16/23   Rhys Boyer T, PA-C  potassium chloride (KLOR-CON) 10 MEQ tablet Take 10 mEq by mouth 2 (two) times daily.    [provider]  QUEtiapine  (SEROQUEL ) 400 MG tablet TAKE 2 & 1/2 (TWO & ONE-HALF) TABLETS BY MOUTH EVERY DAY AT BEDTIME 11/24/23   Rhys Boyer T, PA-C  sildenafil (VIAGRA) 100 MG tablet Take 100 mg by mouth as needed for erectile dysfunction.    [provider]  thiamine  100 MG tablet Take 1 tablet (100 mg total) by mouth daily. 02/10/20   Rhys Boyer DASEN, PA-C    Allergies: Meloxicam and Codeine    Review of Systems  Neurological:  Negative for headaches.    Updated Vital Signs BP (!) 171/91   Pulse 70   Temp 98.3 F (36.8 C) (Oral)   Resp 17   Ht 5' 10 (1.778 m)   Wt 77.6 kg   SpO2 98%   BMI 24.54 kg/m   Physical Exam Vitals and nursing note reviewed.  Constitutional:      General: He  is not in acute distress.    Appearance: He is well-developed.  HENT:     Head: Normocephalic and atraumatic.  Eyes:     Conjunctiva/sclera: Conjunctivae normal.  Cardiovascular:     Rate and Rhythm: Normal rate and regular rhythm.     Heart sounds: No murmur heard. Pulmonary:     Effort: Pulmonary effort is normal. No respiratory distress.     Breath sounds: Normal breath sounds.  Abdominal:     Palpations: Abdomen is soft.     Tenderness: There is no abdominal tenderness.  Musculoskeletal:        General: No swelling.     Cervical back: Neck supple.     Comments: General No obvious deformity. No erythema, edema, contusions, open wounds   Palpation Non-tender to palpation of the clavicles,humerus, radius and ulna, carpal bones, 1st-5th metacarpals  and phalanges bilaterally Non tender over the femur, patella, tibia or fibula bilaterally  Non-tender over the cervical, thoracic, or lumbar spinous processes. Non-tender to palpation of the paraspinal region of the back or chest wall diffusely  No tenderness of the pelvis diffusely  ROM Full ROM of shoulders bilaterally Full elbow, wrist, knee flexion and extension bilaterally Intact plantarflexion and dorsiflexion, hip flexion bilaterally    Skin:    General: Skin is warm and dry.     Capillary Refill: Capillary refill takes less than 2 seconds.     Comments: Bruising to the right forearm   Neurological:     General: No focal deficit present.     Mental Status: He is alert.     Comments: Mental status: Alert and oriented to self, place, and month  Speech: Answers questions appropriately  Cranial Nerves: III, IV, VI: EOM intact, Pupils equal round and reactive, no gaze preference or deviation, no nystagmus. V: normal sensation in V1, V2, and V3 segments bilaterally VII: smiles, puffs cheeks, raises eyebrows, and closes eyes without asymmetry.  VIII: normal hearing to speech IX, X: normal palatal elevation, no uvular deviation XI: 5/5 head turn and 5/5 shoulder shrug bilaterally XII: midline tongue protrusion   Strength: 5/5 strength with resisted elbow and wrist flexion and extension bilaterally 5/5 strength with resisted knee flexion and extension and ankle plantarflexion and dorsiflexion bilaterally   Sensory: Intact sensation in upper and lower extremity bilaterally    Gait: Deferred    Psychiatric:        Mood and Affect: Mood normal.     (all labs ordered are listed, but only abnormal results are displayed) Labs Reviewed  COMPREHENSIVE METABOLIC PANEL WITH GFR - Abnormal; Notable for the following components:      Result Value   Glucose, Bld 112 (*)    Calcium 8.6 (*)    AST 79 (*)    All other components within normal limits  CBC - Abnormal; Notable  for the following components:   RBC 3.81 (*)    Hemoglobin 12.8 (*)    HCT 37.5 (*)    All other components within normal limits  URINALYSIS, ROUTINE W REFLEX MICROSCOPIC - Abnormal; Notable for the following components:   Protein, ur 100 (*)    All other components within normal limits  CK - Abnormal; Notable for the following components:   Total CK 42 (*)    All other components within normal limits  URINALYSIS, MICROSCOPIC (REFLEX) - Abnormal; Notable for the following components:   Bacteria, UA RARE (*)    All other components within normal limits  CBG MONITORING,  ED - Abnormal; Notable for the following components:   Glucose-Capillary 104 (*)    All other components within normal limits  RESP PANEL BY RT-PCR (RSV, FLU A&B, COVID)  RVPGX2  TROPONIN T, HIGH SENSITIVITY    EKG: EKG Interpretation Date/Time:  Tuesday March 19 2024 14:48:31 EDT Ventricular Rate:  76 PR Interval:  134 QRS Duration:  96 QT Interval:  402 QTC Calculation: 452 R Axis:   66  Text Interpretation: Sinus rhythm Confirmed by Cottie Cough [phone]) on 03/19/2024 3:07:30 PM  Radiology: CT HEAD WO CONTRAST Result Date: 03/19/2024 EXAM: CT HEAD WITHOUT CONTRAST 03/19/2024 03:59:02 PM TECHNIQUE: CT of the head was performed without the administration of intravenous contrast. Automated exposure control, iterative reconstruction, and/or weight based adjustment of the mA/kV was utilized to reduce the radiation dose to as low as reasonably achievable. COMPARISON: 06/21/2020 CLINICAL HISTORY: 58 y/o male with unsteady gait- reports multiple falls onto carpeted bedroom floor 2 days ago. Takes xarelto. Spouse reports ongoing confusion since. + blurred vision, + nausea/emesis, + confusion. FINDINGS: BRAIN AND VENTRICLES: No acute hemorrhage. No evidence of acute infarct. No hydrocephalus. No extra-axial collection. No mass effect or midline shift. Minimal hypoattenuation in the periventricular and subcortical white  matter likely reflecting chronic microvascular ischemic changes. ORBITS: Bilateral lens replacement. SINUSES: Mucosal thickening in the right sphenoid sinus. SOFT TISSUES AND SKULL: No acute soft tissue abnormality. No skull fracture. IMPRESSION: 1. No acute intracranial abnormality. Electronically signed by: Cough Mania MD 03/19/2024 04:13 PM EDT RP Workstation: [id number]   DG Chest 2 View Result Date: 03/19/2024 CLINICAL DATA:  Chest pain, syncope. EXAM: CHEST - 2 VIEW COMPARISON:  July 08, 2022. FINDINGS: The heart size and mediastinal contours are within normal limits. Both lungs are clear. The visualized skeletal structures are unremarkable. IMPRESSION: No active cardiopulmonary disease. Electronically Signed   By: Lynwood Landy Raddle M.D.   On: 03/19/2024 16:09     Procedures   Medications Ordered in the ED  lactated ringers  bolus 1,000 mL (1,000 mLs Intravenous New Bag/Given 03/19/24 1655)    Clinical Course as of 03/19/24 1829  Tue Mar 19, 2024  1546 This is a 58 year old man on Xarelto presenting to the ED with complaint for confusion and head injury.  His wife reports that the patient appeared to be more confused, almost drunk on Sunday.  She said there was a fall and he may have struck his head.  She said the patient has been behaving off since then.  She said he has a similar episode in the past that been attributed to low blood pressure.  However they were told to come to the ED due to concern for potential head injury on a blood thinner.  The patient reports he has a very mild headache.  Denies numbness or weakness of the arms or legs.  He does appear mildly confused to me on exam.  He is mildly hypertensive.  Afebrile.  Pending stat CT imaging of the head, labs.  Glucose is normal. [MT]  1808 Workup is largely unremarkable -there is an increase in patient's heart rate orthostatic vital signs in standing with no significant drop in blood pressure.  Remainder of the labs are  unrevealing and CT head thankfully does not show any emergent findings.  The patient and his wife reported this been a waxing and waning issue with intermittent confusion for some time, therefore do not feel that this is abnormal enough to need further workup.  I am in agreement with  this as I have a lower suspicion at this time for cerebellar stroke, encephalopathy, CNS infection, or other life-threatening cause of the symptoms.  He is noted to be on several medications for bipolar disorder, and I do wonder about polypharmacy, though there is no report of any recent changes in his medications.  If he is stable here with ambulation I would advise PCP follow-up [MT]    Clinical Course User Index [MT] Trifan, Donnice PARAS, MD                                 Medical Decision Making Amount and/or Complexity of Data Reviewed Labs: ordered. Radiology: ordered.     Differential diagnosis includes but is not limited to intracranial hemorrhage, skull fracture, fracture, dislocation, pneumothorax, ACS, syncope, orthostatic hypotension, seizure, electrolyte abnormality, rhabdomyolysis  ED Course:  Upon initial evaluation, patient is well-appearing, no acute distress.  Stable vitals aside from high blood pressure at 150/97.  He does have a history of hypertension.  He is alert and answering questions appropriately.  I do not appreciate any neurologic deficits on exam.  He is nontender to palpation of the upper or lower extremities bilaterally.  Nontender to palpation of the cervical, thoracic, or lumbar spine.  Nontender over the hips bilaterally.  No visible signs of head trauma such as ecchymoses, lacerations, hematomas.  However, he did have an unwitnessed fall and is on Xarelto, will obtain CT head for further evaluation.  He also reported intermittent left-sided chest pains for the past couple of weeks.  Lungs clear to auscultation bilaterally.  Cardiac auscultation with regular rate and rhythm and no  murmur appreciated.  Will obtain troponin, EKG, and chest x-ray.  Labs Ordered: I Ordered, and personally interpreted labs.  The pertinent results include:   CBC without leukocytosis.  Hemoglobin low at 12.8, but appears to be at baseline. CMP with slight hypocalcemia at 8.6, mildly elevated AST at 79.  Otherwise CMP within normal limits CBG upon arrival at 104 CK without elevation Urinalysis with protein, no signs of infection Troponin within normal limits  Imaging Studies ordered: I ordered imaging studies including CT head, chest x-ray I independently visualized the imaging with scope of interpretation limited to determining acute life threatening conditions related to emergency care. Imaging showed no acute abnormalities I agree with the radiologist interpretation   Cardiac Monitoring: / EKG: The patient was maintained on a cardiac monitor.  I personally viewed and interpreted the cardiac monitored which showed an underlying rhythm of: Normal sinus rhythm   Medications Given: LR bolus  Upon re-evaluation, patient remains well-appearing with stable vitals aside from elevated blood pressure.  His workup is reassuring today.  CT head did not show any acute abnormality such as skull fracture, intracranial hemorrhage.  His labs appear at baseline.  CBC has mild anemia with hemoglobin 12.8, but this appears to be at baseline.  No leukocytosis, no fevers, no tachycardia, no signs of systemic infection.  CMP without any significant electrolyte abnormalities.  Glucose at 112, no signs of severe hypoglycemia or hyperglycemia.  CK without elevation, no concern for rhabdomyolysis.  Urinalysis did show high specific gravity, and patient appears somewhat dry on exam, he was given LR bolus.  Urinalysis did not show any signs of infection that would explain his falls.  Patient was reporting some intermittent chest pains over the past couple weeks.  Low concern for ACS at this time given pain  has been  ongoing mainly for 3 weeks, pain nonexertional, troponin within normal limits, and EKG without any ST elevations here today.  No signs of arrhythmia on EKG. Per wife at bedside, it does not appear that he has had a seizure like activity with these falls.  Orthostatic vitals do show a slight drop in blood pressure from 150/89 to 134/105 when standing.  Heart rate increases from 70 to 115 from lying to standing.  Patient and wife at bedside report he does have a history of orthostatic hypotension and he has been struggling to keep his blood pressure in the correct range with medications as sometimes it dips too low.  He did feel dizzy when changing positions, orthostatic hypotension could have contributed to his fall. I had a discussion with patient and wife at bedside regarding patient's presentation and symptoms today.  Patient and the wife state that he has had multiple falls over the past couple of months similar to this episode today.  His confusion also seems at baseline.  Thankfully, no acute abnormalities on head CT.  Patient is able to answer questions appropriately, and is alert and oriented x 4.   I offered further evaluation with MRI of the brain to rule out other pathologies such as stroke and that could be contributing to his confusion and falls.  However, they state this has been an ongoing problem, nothing new.  Patient declined any further workup at this time. I do have low clinical concern for stroke as neurologic exam is normal today.  He is on multiple medications, including Xanax , BuSpar , gabapentin , mirtazapine , Seroquel , losartan, hydrochlorothiazide.  Question if there could be a component of polypharmacy contributing to patient's chronic intermittent falls and confusion. Patient was ambulated by nursing, and did have some baseline dizziness with this, but able to get around.  He states this is consistent with his baseline. Patient did request COVID testing today since he has reported some  chills and sweats at night as well as congestion and feeling off for the past 3 days.  His COVID, flu, RSV testing is negative. Suspect he may have mild viral URI.   I discussed patient with my attending Dr. Cottie who also personally evaluated patient and agrees with plan of care. Patient stable and appropriate for discharge home  Impression: Fall  Disposition:  The patient was discharged home with instructions to follow-up with his PCP within the next week.  Patient states he has an appointment next week with his PCP.  Continue to keep well-hydrated at home.  I have recommended that he ask his PCP and psychiatrist about his medications to see if there are any that he may not need to be on as polypharmacy could be contributing to his presentation today. Return precautions given.    Record Review: External records from outside source obtained and reviewed including behavioral health note from 11/16/2023, reviewed patient's medications from this note     This chart was dictated using voice recognition software, Dragon. Despite the best efforts of this provider to proofread and correct errors, errors may still occur which can change documentation meaning.       Final diagnoses:  Fall, initial encounter  Dehydration    ED Discharge Orders     None          Veta Palma, DEVONNA 03/19/24 1830    Cottie Donnice PARAS, MD 03/20/24 1141

## 2024-03-19 NOTE — Discharge Instructions (Addendum)
 You were seen here today for your fall.  Thankfully, the CT of your head did not show any signs of a brain bleed or skull fracture.  No other abnormalities on the head CT to explain your fall.  Your labs are also reassuring.  You had a slightly low hemoglobin which is when your blood counts, but this appears to have been low on your previous labs.  Please have your PCP continue to monitor this value.  Sometimes, a low hemoglobin can cause feelings of weakness. This can be caused by iron deficiency or other causes that your PCP can look into.  You were found to have a slightly low calcium level on your labs today. Please increase your dietary intake of calcium with foods such as dairy products, soy products, broccoli, almonds. Please have your PCP monitor this value.   Your other electrolytes such as sodium, potassium, chloride were normal.  Your liver and kidney labs are normal today.  Your urine showed that you were slightly dehydrated, but no signs of infection.  It is unlikely that your chest pain is due to an issue in your heart. Your cardiac enzyme (troponin) was normal today. Your EKG which is a measure of the heart's electrical activity and rhythm is normal today. These would both show abnormalities if you were having a heart attack.  Your chest x-ray is normal today.  No signs of pneumonia or other abnormality.  Your COVID, flu, RSV testing is negative today.  Your symptoms such as confusion and falls can be secondary to polypharmacy (taking too many medications).  Please review your medications with your PCP and psychiatrist to determine if any of these could be contributing to your symptoms.  Your blood pressure was elevated here today at 171/91. Aim to get 30 minutes of exercise daily and limit intake of processed/fast foods. Please take and record your blood pressures at home. Take them at the same time every day. Follow up with your PCP within the next month to discuss if you may need  treatment for your blood pressure.  Return to the ER if you have any shortness of breath, difficulty breathing, worsening chest pain, worsening dizziness, any unilateral weakness or facial droop, persistent vomiting, any other new or concerning symptoms

## 2024-03-19 NOTE — ED Notes (Signed)
 ED Provider at bedside.

## 2024-03-19 NOTE — Telephone Encounter (Signed)
 S (situation): patient c/o vomiting, dizziness, diarrhea, chills and cold sweats since Sunday  B (background): pthx of syncope, tachycardia  A (assessment): patient reports he has been able to stay hydrated. No shob, no cp, no heart palpitations. Patient denies any injury during fall on Sunday and patient states he did not hit his head. No appointment available today.  R (recommendation): I advised patient to go to Urgent care for evaluation of possible COVID or FLU. Patient agreed to have wife take him.   Provider: For this symptom based call, please confirm advice given to patient is appropriate per your review and consultation.

## 2024-05-13 ENCOUNTER — Other Ambulatory Visit: Payer: Self-pay | Admitting: Physician Assistant

## 2024-05-13 NOTE — Telephone Encounter (Signed)
 Please call to schedule FU, due now.

## 2024-05-14 NOTE — Telephone Encounter (Signed)
 Lvm for patient to call and schedule

## 2024-05-17 ENCOUNTER — Encounter: Payer: Self-pay | Admitting: Physician Assistant

## 2024-05-17 ENCOUNTER — Ambulatory Visit: Admitting: Physician Assistant

## 2024-05-17 DIAGNOSIS — F1021 Alcohol dependence, in remission: Secondary | ICD-10-CM | POA: Diagnosis not present

## 2024-05-17 DIAGNOSIS — F411 Generalized anxiety disorder: Secondary | ICD-10-CM | POA: Diagnosis not present

## 2024-05-17 DIAGNOSIS — F319 Bipolar disorder, unspecified: Secondary | ICD-10-CM | POA: Diagnosis not present

## 2024-05-17 DIAGNOSIS — G4700 Insomnia, unspecified: Secondary | ICD-10-CM | POA: Diagnosis not present

## 2024-05-17 MED ORDER — MIRTAZAPINE 7.5 MG PO TABS: 7.5000 mg | ORAL_TABLET | Freq: Every evening | ORAL | 1 refills | Status: AC | PRN

## 2024-05-17 MED ORDER — GABAPENTIN 300 MG PO CAPS: 900.0000 mg | ORAL_CAPSULE | Freq: Every day | ORAL | 1 refills | Status: AC

## 2024-05-17 MED ORDER — QUETIAPINE FUMARATE 400 MG PO TABS: ORAL_TABLET | ORAL | 1 refills | Status: AC

## 2024-05-17 MED ORDER — BUSPIRONE HCL 30 MG PO TABS: 30.0000 mg | ORAL_TABLET | Freq: Two times a day (BID) | ORAL | 1 refills | Status: AC

## 2024-05-17 NOTE — Progress Notes (Signed)
 [facility]  Patient ID: PACEN WATFORD,  MRN: [id number]  PCP: Marcelline Nicholaus CROME, MD  Date of Evaluation: 05/17/2024 Time spent:25 minutes  Chief Complaint:  Chief Complaint   Follow-up    Virtual Visit via Telehealth  I connected with patient by telephone, with their informed consent, and verified patient privacy and that I am speaking with the correct person using two identifiers.  I am private, in my office and the patient is at work.  I discussed the limitations, risks, security and privacy concerns of performing an evaluation and management service by telephone and the availability of in person appointments. I also discussed with the patient that there may be a patient responsible charge related to this service. The patient expressed understanding and agreed to proceed.   I discussed the assessment and treatment plan with the patient. The patient was provided an opportunity to ask questions and all were answered. The patient agreed with the plan and demonstrated an understanding of the instructions.   The patient was advised to call back or seek an in-person evaluation if the symptoms worsen or if the condition fails to improve as anticipated.  I provided 25 minutes of non-face-to-face time during this encounter.  HISTORY/CURRENT STATUS: HPI  For routine 6 month med check.   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 Doesn't think it'll be too much of an issue finding a new job.  Energy and motivation are good.   No extreme sadness, tearfulness, or feelings of hopelessness.  Sleeps well.  ADLs and personal hygiene are normal.   No reports of memory deficit.  Appetite has not changed.  Weight is stable.  Anxiety is controlled.  He asked about whether gabapentin  can cause him to be loopy or not.  It is not too bad but his wife mentioned it.  Denies alcohol use.  No SI/HI.  No reports of increased energy with decreased need for sleep, increased  talkativeness, racing thoughts, impulsivity or risky behaviors, increased spending, grandiosity, increased irritability or anger, paranoia, or hallucinations.  Individual Medical History/ Review of Systems: Changes? :No      Past medications for mental health diagnoses include: Elavil, Xanax , Celexa, Seroquel , Wellbutrin, Ambien , Depakote, Lamictal, Mirtazepine, Trazodone   Allergies: Meloxicam and Codeine  Current Medications:  Current Outpatient Medications:    albuterol (VENTOLIN HFA) 108 (90 Base) MCG/ACT inhaler, Inhale 2 puffs into the lungs every 6 (six) hours as needed for wheezing or shortness of breath., Disp: , Rfl:    ALPRAZolam  (XANAX ) 0.5 MG tablet, Take 0.5 mg by mouth at bedtime., Disp: , Rfl:    Cyanocobalamin (B-12 PO), Take 1 capsule by mouth daily., Disp: , Rfl:    dexlansoprazole (DEXILANT) 60 MG capsule, Take 60 mg by mouth daily., Disp: , Rfl:    losartan-hydrochlorothiazide (HYZAAR) 50-12.5 MG tablet, Take 1 tablet by mouth daily. 'sometimes', Disp: , Rfl:    potassium chloride (KLOR-CON) 10 MEQ tablet, Take 10 mEq by mouth 2 (two) times daily., Disp: , Rfl:    sildenafil (VIAGRA) 100 MG tablet, Take 100 mg by mouth as needed for erectile dysfunction., Disp: , Rfl:    thiamine  100 MG tablet, Take 1 tablet (100 mg total) by mouth daily., Disp: 30 tablet, Rfl: 1   busPIRone  (BUSPAR ) 30 MG tablet, Take 1 tablet (30 mg total) by mouth 2 (two) times daily., Disp: 180 tablet, Rfl: 1   Dupilumab 300 MG/2ML SOAJ, Inject 300 mg into the skin., Disp: , Rfl:  gabapentin  (NEURONTIN ) 300 MG capsule, Take 3 capsules (900 mg total) by mouth at bedtime., Disp: 270 capsule, Rfl: 1   mirtazapine  (REMERON ) 7.5 MG tablet, Take 1-2 tablets (7.5-15 mg total) by mouth at bedtime as needed. for sleep, Disp: 180 tablet, Rfl: 1   QUEtiapine  (SEROQUEL ) 400 MG tablet, TAKE 2 & 1/2 (TWO & ONE-HALF) TABLETS BY MOUTH EVERY DAY AT BEDTIME, Disp: 225 tablet, Rfl: 1 Medication Side Effects:  none  Family Medical/ Social History: Changes?  no  MENTAL HEALTH EXAM:  There were no vitals taken for this visit.There is no height or weight on file to calculate BMI.  General Appearance: Unable to assess  Eye Contact:  Unable to assess  Speech:  Clear and Coherent  Volume:  Normal  Mood:  Euthymic  Affect:  Unable to assess  Thought Process:  Goal Directed and Descriptions of Associations: Circumstantial  Orientation:  Full (Time, Place, and Person)  Thought Content: Logical   Suicidal Thoughts:  No  Homicidal Thoughts:  No  Memory:  WNL  Judgement:  Good  Insight:  Good  Psychomotor Activity:  Unable to assess  Concentration:  Concentration: Good and Attention Span: Good  Recall:  Good  Fund of Knowledge: Good  Language: Good  Assets:  Communication Skills Desire for Improvement Financial Resources/Insurance Housing Resilience Social Support Transportation Vocational/Educational  ADL's:  Intact  Cognition: WNL  Prognosis:  Good   PCP follows labs  DIAGNOSES:    ICD-10-CM   1. Bipolar I disorder (HCC)  F31.9     2. Generalized anxiety disorder  F41.1     3. Insomnia, unspecified type  G47.00     4. 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  Xanax  prescribed by PCP on 05/11/2024.  Gabapentin  filled 03/01/2024. I provided approximately 25 minutes of non-face-to-face time during this encounter, including time spent before and after the visit in records review, medical decision making, counseling pertinent to today's visit, and charting.   I am sorry to hear about his job loss but hopefully it will not be too hard for him to find a new job.  We discussed the gabapentin .  It can cause loopiness in some people, he has been on it for quite a while though so I do not think it is related to that.  If however that gets worse or is intolerable then we can decrease the dose and see how he does.   He will call and let me know if that is the case.  He is doing well overall so no changes will be made.  Continue Xanax  0.5 mg, 1 p.o. nightly per his PCP. Continue BuSpar  30 mg, 1 p.o. twice daily. Continue Gabapentin  300 mg, 3 qhs.  Continue Mirtazepine 7.5 mg, 1-2 qhs prn. Continue Seroquel  400 mg 2.5 pills po qhs.  Contine thiamine , Multivitamin, B Complex, Vit D, Fish oil. Return in 6 months.   Verneita Cooks, PA-C

## 2024-05-21 ENCOUNTER — Telehealth: Payer: Self-pay

## 2024-05-21 NOTE — Telephone Encounter (Signed)
 PA approved Quetiapine  400 mg 2.5 tablets #225/90 day 04/21/24-05/21/25 [id number] [employer]

## 2024-05-27 ENCOUNTER — Telehealth: Payer: Self-pay | Admitting: Physician Assistant

## 2024-05-27 NOTE — Telephone Encounter (Signed)
 Pt called at 9:06a stating he got 3 of the 4 medications Teresa sent in on 11/14, but the pharmacy is not seeing the script for the Gabapentin .  Pls call to see what the problem is.  Confirmed pharmacy is  [id number] [place], [place] - [address], [place] [address] Phone: [phone]  Fax: [phone]   No upcoming appt scheduled.

## 2024-05-27 NOTE — Telephone Encounter (Signed)
 Called pharmacy and was told when Rx sent it was too early to fill. They ran it today and it went thru for $0, will fill it today. Patient notified.

## 2024-06-06 ENCOUNTER — Emergency Department (HOSPITAL_BASED_OUTPATIENT_CLINIC_OR_DEPARTMENT_OTHER)
Admission: EM | Admit: 2024-06-06 | Discharge: 2024-06-06 | Disposition: A | Discharge status: Home / Self Care | Attending: Emergency Medicine | Admitting: Emergency Medicine

## 2024-06-06 ENCOUNTER — Encounter (HOSPITAL_BASED_OUTPATIENT_CLINIC_OR_DEPARTMENT_OTHER): Payer: Self-pay

## 2024-06-06 ENCOUNTER — Emergency Department (HOSPITAL_BASED_OUTPATIENT_CLINIC_OR_DEPARTMENT_OTHER)

## 2024-06-06 ENCOUNTER — Other Ambulatory Visit: Payer: Self-pay

## 2024-06-06 DIAGNOSIS — S0101XA Laceration without foreign body of scalp, initial encounter: Secondary | ICD-10-CM

## 2024-06-06 DIAGNOSIS — S060X0A Concussion without loss of consciousness, initial encounter: Secondary | ICD-10-CM

## 2024-06-06 NOTE — Discharge Instructions (Addendum)
 Follow up with your Physician for recheck.  Return if any problems.

## 2024-06-06 NOTE — ED Triage Notes (Addendum)
 Pt reports dogs knocked feet out from under him 3 weeks ago causing him to fall and hit right side of head. Complains of continued right sided head pain and dizziness. Dizzy at all times and when he moves his eyes Pt is on Xarelton for PE  No LOC at time of fall

## 2024-06-06 NOTE — ED Provider Notes (Signed)
 [facility] Provider Note   CSN: [id number] Arrival date & time: 06/06/24  9043     Patient presents with: Head Injury   Guy Mayer is a 58 y.o. male.   Patient complains that he fell and struck his head 3 weeks ago.  Patient complains of having been dizzy since hitting his head.  Patient reports he cut the front of his scalp.  He states it seems like it has taken it a long time to heal.  Patient denies any injury to any other areas.  Patient states he did not have a loss of consciousness.  He is concerned because the dizziness is not improving.  Patient reports he has a past medical history of hypertension.  The history is provided by the patient. No language interpreter was used.  Head Injury      Prior to Admission medications   Medication Sig Start Date End Date Taking? Authorizing Provider  albuterol (VENTOLIN HFA) 108 (90 Base) MCG/ACT inhaler Inhale 2 puffs into the lungs every 6 (six) hours as needed for wheezing or shortness of breath. 04/08/13   [provider]  ALPRAZolam  (XANAX ) 0.5 MG tablet Take 0.5 mg by mouth at bedtime. 04/07/21   [provider]  busPIRone  (BUSPAR ) 30 MG tablet Take 1 tablet (30 mg total) by mouth 2 (two) times daily. 05/17/24   Rhys Boyer T, PA-C  Cyanocobalamin (B-12 PO) Take 1 capsule by mouth daily.    [provider]  dexlansoprazole (DEXILANT) 60 MG capsule Take 60 mg by mouth daily.    [provider]  Dupilumab 300 MG/2ML SOAJ Inject 300 mg into the skin.    [provider]  gabapentin  (NEURONTIN ) 300 MG capsule Take 3 capsules (900 mg total) by mouth at bedtime. 05/17/24   Rhys Boyer DASEN, PA-C  losartan-hydrochlorothiazide (HYZAAR) 50-12.5 MG tablet Take 1 tablet by mouth daily. 'sometimes'    [provider]  mirtazapine  (REMERON ) 7.5 MG tablet Take 1-2 tablets (7.5-15 mg total) by mouth at bedtime as needed. for sleep 05/17/24   Rhys Boyer T, PA-C  potassium chloride (KLOR-CON) 10 MEQ tablet Take 10 mEq by mouth 2 (two) times daily.    [provider]  QUEtiapine  (SEROQUEL ) 400 MG tablet TAKE 2 & 1/2 (TWO & ONE-HALF) TABLETS BY MOUTH EVERY DAY AT BEDTIME 05/17/24   Rhys Boyer T, PA-C  sildenafil (VIAGRA) 100 MG tablet Take 100 mg by mouth as needed for erectile dysfunction.    [provider]  thiamine  100 MG tablet Take 1 tablet (100 mg total) by mouth daily. 02/10/20   Rhys Boyer DASEN, PA-C    Allergies: Meloxicam and Codeine    Review of Systems  All other systems reviewed and are negative.   Updated Vital Signs BP (!) 166/102   Pulse 90   Temp 98 F (36.7 C)   Resp 13   Wt 83.9 kg   SpO2 94%   BMI 26.54 kg/m   Physical Exam Vitals and nursing note reviewed.  Constitutional:      Appearance: He is well-developed.  HENT:     Head: Normocephalic.     Comments:  healing laceration in frontal scalp    Right Ear: Tympanic membrane normal.     Left Ear: Tympanic membrane normal.     Mouth/Throat:     Mouth: Mucous membranes are moist.  Eyes:     Pupils: Pupils are equal, round, and reactive to light.  Cardiovascular:     Rate and Rhythm: Normal rate.  Pulmonary:     Effort: Pulmonary effort is normal.  Abdominal:     General: There is no distension.  Musculoskeletal:        General: Normal range of motion.     Cervical back: Normal range of motion.  Skin:    General: Skin is warm.  Neurological:     General: No focal deficit present.     Mental Status: He is alert and oriented to person, place, and time.  Psychiatric:        Mood and Affect: Mood normal.        Behavior: Behavior normal.     (all labs ordered are listed, but only abnormal results are displayed) Labs Reviewed - No data to display  EKG: None  Radiology: CT Head Wo Contrast Result Date: 06/06/2024 EXAM: CT HEAD WITHOUT CONTRAST 06/06/2024 11:27:00 AM TECHNIQUE: CT of the head was performed without the  administration of intravenous contrast. Automated exposure control, iterative reconstruction, and/or weight based adjustment of the mA/kV was utilized to reduce the radiation dose to as low as reasonably achievable. COMPARISON: Head CT 03/19/2024. CLINICAL HISTORY: 58 year old male with continued dizziness and blurred vision after a fall a couple of weeks ago. FINDINGS: BRAIN AND VENTRICLES: Brain volume is stable and within normal limits for age. No acute hemorrhage. No evidence of acute infarct. No hydrocephalus. No extra-axial collection. No mass effect or midline shift. Mild for age patchy periventricular white matter hypodensity, stable. Stable mild asymmetric involvement of the deep white matter capsules. No suspicious intracranial vascular hyperdensity. ORBITS: Stable orbital soft tissues. SINUSES: Visible paranasal sinuses, tympanic cavities and mastoids are clear. SOFT TISSUES AND SKULL: Stable scalp soft tissues. No skull fracture. IMPRESSION: 1. No recent traumatic injury identified. No acute intracranial abnormality. 2. Stable mild for age chronic white matter disease. Electronically signed by: Helayne Hurst MD 06/06/2024 11:40 AM EST RP Workstation: [id number]     Procedures   Medications Ordered in the ED - No data to display                                  Medical Decision Making Patient reports he fell 3 weeks ago and struck his head.  He has had dizziness on and off since.  Amount and/or Complexity of Data Reviewed Radiology: ordered and independent interpretation performed. Decision-making details documented in ED Course.    Details: CT head no acute findings  Risk Risk Details: Patient counseled on CT findings.  Patient is advised to follow-up with his primary care physician.  He is advised to continue Tylenol as needed for discomfort.  Return if any problems        Final diagnoses:  Concussion without loss of consciousness, initial encounter  Laceration of scalp,  initial encounter    ED Discharge Orders     None      An After Visit Summary was printed and given to the patient.     Flint Sonny POUR, PA-C 06/06/24 1534    Elnor Jayson LABOR, DO 06/11/24 [phone]

## 2024-07-31 ENCOUNTER — Emergency Department (HOSPITAL_BASED_OUTPATIENT_CLINIC_OR_DEPARTMENT_OTHER)
Admission: EM | Admit: 2024-07-31 | Discharge: 2024-07-31 | Disposition: A | Discharge status: Home / Self Care | Attending: Emergency Medicine | Admitting: Emergency Medicine

## 2024-07-31 ENCOUNTER — Emergency Department (HOSPITAL_BASED_OUTPATIENT_CLINIC_OR_DEPARTMENT_OTHER)

## 2024-07-31 ENCOUNTER — Other Ambulatory Visit: Payer: Self-pay

## 2024-07-31 ENCOUNTER — Encounter (HOSPITAL_BASED_OUTPATIENT_CLINIC_OR_DEPARTMENT_OTHER): Payer: Self-pay

## 2024-07-31 DIAGNOSIS — R42 Dizziness and giddiness: Secondary | ICD-10-CM | POA: Insufficient documentation

## 2024-07-31 DIAGNOSIS — Z7901 Long term (current) use of anticoagulants: Secondary | ICD-10-CM | POA: Diagnosis not present

## 2024-07-31 DIAGNOSIS — I10 Essential (primary) hypertension: Secondary | ICD-10-CM | POA: Diagnosis not present

## 2024-07-31 DIAGNOSIS — R7401 Elevation of levels of liver transaminase levels: Secondary | ICD-10-CM | POA: Insufficient documentation

## 2024-07-31 DIAGNOSIS — M546 Pain in thoracic spine: Secondary | ICD-10-CM | POA: Diagnosis not present

## 2024-07-31 DIAGNOSIS — R296 Repeated falls: Secondary | ICD-10-CM | POA: Insufficient documentation

## 2024-07-31 DIAGNOSIS — R10A3 Flank pain, bilateral: Secondary | ICD-10-CM | POA: Diagnosis present

## 2024-07-31 DIAGNOSIS — Z79899 Other long term (current) drug therapy: Secondary | ICD-10-CM | POA: Insufficient documentation

## 2024-07-31 DIAGNOSIS — R7989 Other specified abnormal findings of blood chemistry: Secondary | ICD-10-CM | POA: Insufficient documentation

## 2024-07-31 DIAGNOSIS — M545 Low back pain, unspecified: Secondary | ICD-10-CM | POA: Insufficient documentation

## 2024-07-31 HISTORY — DX: Saddle embolus of pulmonary artery without acute cor pulmonale: I26.92

## 2024-07-31 LAB — TROPONIN T, HIGH SENSITIVITY: Troponin T High Sensitivity: 22 ng/L — ABNORMAL HIGH (ref 0–19)

## 2024-07-31 MED ORDER — LIDOCAINE 5 % EX PTCH
2.0000 | MEDICATED_PATCH | Freq: Once | CUTANEOUS | Status: DC
  Administered 2024-07-31: 2 via TRANSDERMAL
  Filled 2024-07-31: qty 2

## 2024-07-31 MED ORDER — METHOCARBAMOL 500 MG PO TABS
1000.0000 mg | ORAL_TABLET | Freq: Once | ORAL | Status: AC
  Administered 2024-07-31: 1000 mg via ORAL
  Filled 2024-07-31: qty 2

## 2024-07-31 MED ORDER — OXYCODONE HCL 5 MG PO TABS: 5.0000 mg | ORAL_TABLET | Freq: Four times a day (QID) | ORAL | 0 refills | Status: AC | PRN

## 2024-07-31 MED ORDER — LIDOCAINE 5 % EX PTCH: 1.0000 | MEDICATED_PATCH | CUTANEOUS | 0 refills | Status: AC

## 2024-07-31 MED ORDER — IOHEXOL 300 MG/ML  SOLN
75.0000 mL | Freq: Once | INTRAMUSCULAR | Status: AC | PRN
  Administered 2024-07-31: 75 mL via INTRAVENOUS

## 2024-07-31 MED ORDER — OXYCODONE HCL 5 MG PO TABS
5.0000 mg | ORAL_TABLET | Freq: Once | ORAL | Status: AC
  Administered 2024-07-31: 5 mg via ORAL
  Filled 2024-07-31: qty 1

## 2024-07-31 MED ORDER — METHOCARBAMOL 500 MG PO TABS: 1000.0000 mg | ORAL_TABLET | Freq: Three times a day (TID) | ORAL | 0 refills | Status: AC | PRN

## 2024-07-31 NOTE — ED Notes (Signed)
 Report received from [place], [place]. Assuming patient care at this time.

## 2024-07-31 NOTE — ED Provider Notes (Signed)
 " [facility] Provider Note   CSN: [id number] Arrival date & time: 07/31/24  1052     Patient presents with: Guy Mayer   Guy Mayer is a 59 y.o. male.   Patient with history of hypertension, hyperlipidemia, GERD, PE on anticoagulation -- presents to the emergency department for evaluation of back and flank pain.  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  Patient currently on anticoagulation.  He reports recent dizzy spells and frequent falls, described as lightheadedness and syncope.  He has had falls, has hit his head in the past.  This complaint was noted in primary care note from 07/19/2024.  It appears that referral to cardiology may have been made at that visit.  Patient had a heme-onc appointment today.  I cannot see details regarding this but he did have labs drawn this morning.    Patient's presenting concern today was for bilateral flank pain.  He reports right mid back pain after becoming dizzy and falling in his garage 3 days ago.  The following day he had another episode and he fell onto concrete, onto his left side while trying to get ice out of a dog bowl.  Patient reports intense pain with movement.  No difficulty breathing or shortness of breath.  No weakness, numbness, or tingling in the legs.  No hip pain.  He drove himself to the emergency department today.  He has been taking Advil for pain.  Pt reports seeing hematology today -- restarting anticoagulation.       Prior to Admission medications  Medication Sig Start Date End Date Taking? Authorizing Provider  albuterol (VENTOLIN HFA) 108 (90 Base) MCG/ACT inhaler Inhale 2 puffs into the lungs every 6 (six) hours as needed for wheezing or shortness of breath. 04/08/13   [provider]  ALPRAZolam  (XANAX ) 0.5 MG tablet Take 0.5 mg by mouth at bedtime. 04/07/21   [provider]  busPIRone  (BUSPAR ) 30 MG tablet Take 1 tablet (30 mg total) by  mouth 2 (two) times daily. 05/17/24   Rhys Boyer T, PA-C  Cyanocobalamin (B-12 PO) Take 1 capsule by mouth daily.    [provider]  dexlansoprazole (DEXILANT) 60 MG capsule Take 60 mg by mouth daily.    [provider]  Dupilumab 300 MG/2ML SOAJ Inject 300 mg into the skin.    [provider]  gabapentin  (NEURONTIN ) 300 MG capsule Take 3 capsules (900 mg total) by mouth at bedtime. 05/17/24   Rhys Boyer DASEN, PA-C  losartan-hydrochlorothiazide (HYZAAR) 50-12.5 MG tablet Take 1 tablet by mouth daily. 'sometimes'    [provider]  mirtazapine  (REMERON ) 7.5 MG tablet Take 1-2 tablets (7.5-15 mg total) by mouth at bedtime as needed. for sleep 05/17/24   Rhys Boyer T, PA-C  potassium chloride (KLOR-CON) 10 MEQ tablet Take 10 mEq by mouth 2 (two) times daily.    [provider]  QUEtiapine  (SEROQUEL ) 400 MG tablet TAKE 2 & 1/2 (TWO & ONE-HALF) TABLETS BY MOUTH EVERY DAY AT BEDTIME 05/17/24   Rhys Boyer T, PA-C  sildenafil (VIAGRA) 100 MG tablet Take 100 mg by mouth as needed for erectile dysfunction.    [provider]  thiamine  100 MG tablet Take 1 tablet (100 mg total) by mouth daily. 02/10/20   Rhys Boyer DASEN, PA-C    Allergies: Meloxicam and Codeine    Review of Systems  Updated Vital Signs BP (!) 170/95   Pulse 79  Temp 99.2 F (37.3 C)   Resp 20   Wt 79.4 kg   SpO2 100%   BMI 25.11 kg/m   Physical Exam Vitals and nursing note reviewed.  Constitutional:      General: He is not in acute distress.    Appearance: He is well-developed.  HENT:     Head: Normocephalic and atraumatic.  Eyes:     General:        Right eye: No discharge.        Left eye: No discharge.     Conjunctiva/sclera: Conjunctivae normal.  Cardiovascular:     Rate and Rhythm: Normal rate and regular rhythm.     Heart sounds: Normal heart sounds.  Pulmonary:     Effort: Pulmonary effort is normal.     Breath sounds: Normal breath sounds.   Abdominal:     Palpations: Abdomen is soft.     Tenderness: There is no abdominal tenderness.  Musculoskeletal:     Cervical back: Normal range of motion and neck supple. No tenderness. Normal range of motion.     Thoracic back: Tenderness present. No bony tenderness. Normal range of motion.     Lumbar back: Tenderness present. No bony tenderness.       Back:     Comments: Patient winces in pain while turning in bed.  He has most significant pain in the right flank area.  Also pain in the left lumbar back.  I do not see any obvious ecchymosis or abrasions.  No step-offs of the cervical, thoracic, or lumbar spine.  Skin:    General: Skin is warm and dry.  Neurological:     Mental Status: He is alert.     (all labs ordered are listed, but only abnormal results are displayed) Labs Reviewed  TROPONIN T, HIGH SENSITIVITY - Abnormal; Notable for the following components:      Result Value   Troponin T High Sensitivity 22 (*)    All other components within normal limits    EKG: EKG Interpretation Date/Time:  Wednesday July 31 2024 11:08:55 EST Ventricular Rate:  79 PR Interval:  128 QRS Duration:  99 QT Interval:  409 QTC Calculation: 469 R Axis:   66  Text Interpretation: Sinus rhythm no acute ST/T changes Confirmed by Freddi Hamilton [id number]) on 07/31/2024 1:09:04 PM  Radiology: [facility] Chest 2 View Result Date: 07/31/2024 EXAM: 2 VIEW(S) XRAY OF THE CHEST 07/31/2024 12:37:00 PM COMPARISON: 03/19/2024 CLINICAL HISTORY: Recent falls, dizziness, syncope. FINDINGS: LUNGS AND PLEURA: No focal pulmonary opacity. No pleural effusion. No pneumothorax. HEART AND MEDIASTINUM: No acute abnormality of the cardiac and mediastinal silhouettes. BONES AND SOFT TISSUES: ACDF. Thoracic spondylosis. IMPRESSION: 1. No acute cardiopulmonary abnormality. Electronically signed by: Dasie Hamburg MD 07/31/2024 01:04 PM EST RP Workstation: [id number]   CT ABDOMEN PELVIS W CONTRAST Result Date:  07/31/2024 EXAM: CT ABDOMEN AND PELVIS WITH CONTRAST 07/31/2024 12:38:09 PM TECHNIQUE: CT of the abdomen and pelvis was performed with the administration of 75 mL of iohexol  (OMNIPAQUE ) 300 MG/ML solution. Multiplanar reformatted images are provided for review. Automated exposure control, iterative reconstruction, and/or weight-based adjustment of the mA/kV was utilized to reduce the radiation dose to as low as reasonably achievable. COMPARISON: CTA chest and abdomen 12/30/2022. CLINICAL HISTORY: Abdominal trauma, blunt; 2 falls over the past 3 days, flank pain (creatinine 1.03 at 9:11am -- labs drawn at [facility] this AM available in [facility]). FINDINGS: LOWER CHEST: No acute abnormality. LIVER: Diffusely decreased attenuation of the liver compatible with  steatosis. 9 mm hypodensity in the posterior right hepatic lobe, too small to fully characterize. GALLBLADDER AND BILE DUCTS: Status post cholecystectomy. Mild extrahepatic biliary prominence which is likely physiologic. SPLEEN: No acute abnormality. PANCREAS: No acute abnormality. ADRENAL GLANDS: No acute abnormality. KIDNEYS, URETERS AND BLADDER: Mild bilateral renal scarring. No renal mass. No stones in the kidneys or ureters. No hydronephrosis. No perinephric or periureteral stranding. Urinary bladder is unremarkable. GI AND BOWEL: Stomach demonstrates no acute abnormality. There is no bowel obstruction. PERITONEUM AND RETROPERITONEUM: No ascites. No free air. VASCULATURE: Mild abdominal aortic atherosclerosis without aneurysm. LYMPH NODES: No lymphadenopathy. REPRODUCTIVE ORGANS: No acute abnormality. BONES AND SOFT TISSUES: No acute osseous abnormality. Lumbar spine reported separately. No focal soft tissue abnormality. IMPRESSION: 1. No acute abnormality identified in the abdomen or pelvis. Electronically signed by: Dasie Hamburg MD 07/31/2024 01:03 PM EST RP Workstation: [id number]   CT L-SPINE NO CHARGE Result Date: 07/31/2024 EXAM: CT OF THE LUMBAR  SPINE WITHOUT CONTRAST 07/31/2024 12:38:09 PM TECHNIQUE: CT of the lumbar spine was performed without the administration of intravenous contrast. Multiplanar reformatted images are provided for review. Automated exposure control, iterative reconstruction, and/or weight based adjustment of the mA/kV was utilized to reduce the radiation dose to as low as reasonably achievable. COMPARISON: CTA chest, abdomen, and pelvis 12/30/2022. CLINICAL HISTORY: Abdominal trauma, blunt. 2 recent falls. Flank pain. FINDINGS: BONES AND ALIGNMENT: 5 lumbar type vertebrae. No acute fracture or suspicious bone lesion. Normal alignment. DEGENERATIVE CHANGES: Mild lumbar spondylosis with anterior vertebral spurring being most notable at L1-L2. Mild multilevel facet arthrosis. No evidence of high grade spinal canal stenosis. SOFT TISSUES: No acute abnormality in the paraspinal soft tissues. Remainder of the abdomen and pelvis reported separately. IMPRESSION: 1. No acute osseous abnormality in the lumbar spine. Electronically signed by: Dasie Hamburg MD 07/31/2024 12:56 PM EST RP Workstation: [id number]   CT Head Wo Contrast Result Date: 07/31/2024 EXAM: CT HEAD WITHOUT CONTRAST 07/31/2024 12:38:09 PM TECHNIQUE: CT of the head was performed without the administration of intravenous contrast. Automated exposure control, iterative reconstruction, and/or weight based adjustment of the mA/kV was utilized to reduce the radiation dose to as low as reasonably achievable. COMPARISON: Head CT 06/06/2024. CLINICAL HISTORY: Syncope or presyncope, cerebrovascular cause suspected; recent falls, has hit head. FINDINGS: BRAIN AND VENTRICLES: There is no evidence of an acute infarct, intracranial hemorrhage, mass, midline shift, hydrocephalus, or extra-axial fluid collection. Cerebral volume is within normal limits for age. Bilateral cerebral white matter hypodensities are unchanged and nonspecific but compatible with mild chronic small vessel ischemic  disease. ORBITS: Bilateral cataract extraction. SINUSES: No acute abnormality. SOFT TISSUES AND SKULL: No acute soft tissue abnormality. No skull fracture. IMPRESSION: 1. No acute intracranial abnormality. 2. Mild chronic small vessel ischemic disease. Electronically signed by: Dasie Hamburg MD 07/31/2024 12:52 PM EST RP Workstation: [id number]     Procedures   Medications Ordered in the ED - No data to display  ED Course  Patient seen and examined. History obtained directly from patient who is uncomfortable, relatively poor historian.  He does recount some details regarding his recent falls.  I reviewed recent lab work.  This was performed at 9:11 AM today.  And included CBC remarkable for white blood cell count of 4.0, hemoglobin of 11.9.  Also had CMP with creatinine of 1.03, alkaline phosphatase mildly elevated at 129, AST mildly elevated at 61, normal ALT at 18.  Labs/EKG: Ordered troponin.  Imaging: Ordered given recent falls and trauma along with anticoagulation use, CT  of the abdomen and pelvis ordered to evaluate for flank pain and possible retroperitoneal hemorrhage contributing.  Will also look at the lumbar spine.  I have ordered a chest x-ray due to recent falls and syncope as well as a head CT due to recent falls and syncope.  Patient does not report hitting his head over the past couple of days but has had in the past several weeks.  Would like to rule out possibility of subdural hemorrhage.  Medications/Fluids: None ordered  Most recent vital signs reviewed and are as follows: BP (!) 170/95   Pulse 79   Temp 99.2 F (37.3 C)   Resp 20   Wt 79.4 kg   SpO2 100%   BMI 25.11 kg/m   Initial impression: Patient is here today for flank and back pain after a couple of recent falls.  This is in the setting of recent dizziness/lightheadedness and syncopal spells.  It sounds like he has fallen multiple times over the past several weeks to a couple of months.  No focal neurologic  symptoms to suggest stroke.  No chest pain or shortness of breath today to suggest PE.  1:21 PM Reassessment performed. Patient appears stable. He is requesting something stronger for pain, is going to call his wife to come take him home.  Reaffirmed patient has not developed any chest pain or shortness of breath.  He states that his lower back hurts when he takes a deep breath.  Labs personally reviewed and interpreted including: Troponin at 22, minimally elevated.  Imaging personally visualized and interpreted including: CT head agree no acute findings; CT abdomen pelvis, agree no acute findings; CT L-spine agree no acute findings.  Chest x-ray, or other acute problems.  Reviewed pertinent lab work and imaging with patient at bedside. Questions answered.   Most current vital signs reviewed and are as follows: BP (!) 162/93 (BP Location: Right Arm)   Pulse 87   Temp (P) 98.6 F (37 C)   Resp 15   Ht 5' 10 (1.778 m)   Wt 79.4 kg   SpO2 99%   BMI 25.12 kg/m   Plan: Discharge to home.  Oxycodone  5 mg, Robaxin  1000 mg, Lidoderm  patches ordered.  Patient calling for a ride.  Prescriptions written for: Oxycodone , # 4 tablets, Robaxin , Lidoderm .  Discussed to use pain medication only under direct supervision at the lowest possible dose needed to control your pain.   Other home care instructions discussed: Ice and heat on sore areas, no strenuous activities, gentle stretching.  ED return instructions discussed: Return with worsening chest pain, shortness of breath or trouble breathing.  Return with uncontrolled pain, inability to walk.  Follow-up instructions discussed: Patient encouraged to follow-up with their PCP in 3 days.   Discussed case with Dr. Freddi who agrees with plan.                                   Medical Decision Making Amount and/or Complexity of Data Reviewed Radiology: ordered.  Risk Prescription drug management.   Back and flank pain after falls over  the past 3 to 4 days.  Patient is on anticoagulation, recent break but restarting as of today.  Trauma imaging including CT of the abdomen and pelvis with contrast, L-spine, chest x-ray --all negative.  Will focus on symptom control as above.  No indication for transfer for MRI.  Recent dizziness: This has been ongoing  and subacute in nature.  He has mentioned this to PCP.  CT head today did not show any signs of bleeding related to recent falls.  Unclear etiology at this point.  Lab workup performed as outpatient today was reassuring.  Mildly elevated troponin.  Isolated and patient without chest pain or shortness of breath.  He is not tachycardic.  I have low concern for worsening PE given constellation of symptoms.  I do not suspect ACS.  The patient's vital signs, pertinent lab work and imaging were reviewed and interpreted as discussed in the ED course. Hospitalization was considered for further testing, treatments, or serial exams/observation. However as patient is well-appearing, has a stable exam, and reassuring studies today, I do not feel that they warrant admission at this time. This plan was discussed with the patient who verbalizes agreement and comfort with this plan and seems reliable and able to return to the Emergency Department with worsening or changing symptoms.       Final diagnoses:  Bilateral flank pain  Dizziness  Frequent falls    ED Discharge Orders          Ordered    oxyCODONE  (OXY IR/ROXICODONE ) 5 MG immediate release tablet  Every 6 hours PRN        07/31/24 1329    methocarbamol  (ROBAXIN ) 500 MG tablet  Every 8 hours PRN        07/31/24 1329    lidocaine  (LIDODERM ) 5 %  Every 24 hours        07/31/24 1329               Desiderio Chew, PA-C 07/31/24 1330    Freddi Hamilton, MD 08/05/24 [phone]  "

## 2024-07-31 NOTE — ED Notes (Signed)
 Patient transported to CT

## 2024-07-31 NOTE — ED Triage Notes (Signed)
 Pt arrived POV. Reports dizziness on Sunday causing him to fall on carport landed on tool box left mid back . Then fell Monday while in dog lot while bending over and hit right mid back. Bruising noted to right and left mid back/flank area. Pt has been off blood thinners a week and supposed to restart today

## 2024-07-31 NOTE — Discharge Instructions (Signed)
 Please read and follow all provided instructions.  Your diagnoses today include:  1. Bilateral flank pain   2. Dizziness   3. Frequent falls    Tests performed today include: CT scan of the head: Did not show any signs of trauma to the head or any causes of recurrent dizziness CT scan of the abdomen pelvis and CT scan of the lumbar spine: Did not show any signs of acute trauma from your recent falls, no signs of internal bleeding Chest x-ray: Was clear, no pneumonia or traumatic injury Troponin: Blood test for the heart was minimally elevated, but low concern currently for heart attack or significant blood clot. The labs performed as outpatient today were reviewed, appear to be consistent with previous baseline labs Vital signs. See below for your results today.   Medications prescribed:  Robaxin  (methocarbamol ) - muscle relaxer medication  DO NOT drive or perform any activities that require you to be awake and alert because this medicine can make you drowsy.   Oxycodone  - narcotic pain medication  DO NOT drive or perform any activities that require you to be awake and alert because this medicine can make you drowsy.   Lidocaine  patches - topical numbing patch for pain  Take any prescribed medications only as directed.  Home care instructions:  Follow any educational materials contained in this packet.  BE VERY CAREFUL not to take multiple medicines containing Tylenol (also called acetaminophen). Doing so can lead to an overdose which can damage your liver and cause liver failure and possibly death.   Follow-up instructions: Please follow-up with your primary care provider in the next 3 days for further evaluation of your symptoms.   Return instructions:  Please return to the Emergency Department if you experience worsening symptoms.  Please return if you develop chest pain or shortness of breath. Please return if you have additional episodes of passing out, severe headache or  uncontrolled pain. Please return if you have any other emergent concerns.  Additional Information:  Your vital signs today were: BP (!) 162/93 (BP Location: Right Arm)   Pulse 87   Temp (P) 98.6 F (37 C) (Oral)   Resp 15   Ht 5' 10 (1.778 m)   Wt 79.4 kg   SpO2 99%   BMI 25.12 kg/m  If your blood pressure (BP) was elevated above 135/85 this visit, please have this repeated by your doctor within one month. --------------
